# Patient Record
Sex: Female | Born: 1955
Health system: Southern US, Community
[De-identification: ages and names within clinical notes are randomized; demographics above are authoritative.]

## PROBLEM LIST (undated history)

## (undated) DIAGNOSIS — M8580 Other specified disorders of bone density and structure, unspecified site: Secondary | ICD-10-CM

## (undated) DIAGNOSIS — I10 Essential (primary) hypertension: Secondary | ICD-10-CM

## (undated) DIAGNOSIS — E785 Hyperlipidemia, unspecified: Secondary | ICD-10-CM

## (undated) HISTORY — DX: Other specified disorders of bone density and structure, unspecified site: M85.80

## (undated) HISTORY — PX: BUNIONECTOMY: SHX129

## (undated) HISTORY — DX: Essential (primary) hypertension: I10

## (undated) HISTORY — DX: Hyperlipidemia, unspecified: E78.5

---

## 1988-10-31 HISTORY — PX: MANDIBLE SURGERY: SHX707

## 2000-04-05 ENCOUNTER — Other Ambulatory Visit: Admission: RE | Admit: 2000-04-05 | Discharge: 2000-04-05 | Payer: Self-pay | Admitting: Obstetrics and Gynecology

## 2002-05-07 ENCOUNTER — Other Ambulatory Visit: Admission: RE | Admit: 2002-05-07 | Discharge: 2002-05-07 | Payer: Self-pay | Admitting: Obstetrics and Gynecology

## 2003-06-16 ENCOUNTER — Other Ambulatory Visit: Admission: RE | Admit: 2003-06-16 | Discharge: 2003-06-16 | Payer: Self-pay | Admitting: Obstetrics and Gynecology

## 2004-08-09 ENCOUNTER — Other Ambulatory Visit: Admission: RE | Admit: 2004-08-09 | Discharge: 2004-08-09 | Payer: Self-pay | Admitting: Obstetrics and Gynecology

## 2005-11-09 ENCOUNTER — Other Ambulatory Visit: Admission: RE | Admit: 2005-11-09 | Discharge: 2005-11-09 | Payer: Self-pay | Admitting: Obstetrics and Gynecology

## 2007-03-12 ENCOUNTER — Ambulatory Visit: Payer: Self-pay | Admitting: Gastroenterology

## 2007-04-06 ENCOUNTER — Encounter: Payer: Self-pay | Admitting: Gastroenterology

## 2007-04-06 ENCOUNTER — Ambulatory Visit: Payer: Self-pay | Admitting: Gastroenterology

## 2008-05-29 LAB — CONVERTED CEMR LAB: Pap Smear: NORMAL

## 2008-08-04 ENCOUNTER — Ambulatory Visit: Payer: Self-pay | Admitting: *Deleted

## 2008-08-04 DIAGNOSIS — L259 Unspecified contact dermatitis, unspecified cause: Secondary | ICD-10-CM

## 2008-08-04 DIAGNOSIS — G43009 Migraine without aura, not intractable, without status migrainosus: Secondary | ICD-10-CM | POA: Insufficient documentation

## 2008-08-04 DIAGNOSIS — E785 Hyperlipidemia, unspecified: Secondary | ICD-10-CM | POA: Insufficient documentation

## 2008-08-19 ENCOUNTER — Encounter (INDEPENDENT_AMBULATORY_CARE_PROVIDER_SITE_OTHER): Payer: Self-pay | Admitting: *Deleted

## 2008-09-15 ENCOUNTER — Ambulatory Visit: Payer: Self-pay | Admitting: *Deleted

## 2008-09-15 DIAGNOSIS — B354 Tinea corporis: Secondary | ICD-10-CM | POA: Insufficient documentation

## 2009-08-31 ENCOUNTER — Encounter: Admission: RE | Admit: 2009-08-31 | Discharge: 2009-10-21 | Payer: Self-pay | Admitting: Orthopedic Surgery

## 2011-03-15 NOTE — Assessment & Plan Note (Signed)
[facility]                         GASTROENTEROLOGY OFFICE NOTE   NAME:Worden, Donna Mercer                        MRN:          [id number]  DATE:03/12/2007                            DOB:          01/12/1956    REFERRING PHYSICIAN:  Randye Lobo, M.D.   REASON FOR REFERRAL:  Dr. Edward Jolly asked Donna Mercer to see me in  consultation regarding colorectal cancer screening.   HISTORY OF PRESENT ILLNESS:  Donna Mercer is a very pleasant, 55 year old  woman who was initially referred here for colorectal cancer screening.  By coincidence 2 weeks ago, she had a day of several loose stools,  vomiting and abdominal discomfort. Since then she has had 1 or 2 loose  stools on a daily basis. This has been nonbloody and non-mucusy. Her  abdominal discomfort has subsided.   REVIEW OF SYSTEMS:  Notable for stable weight and is otherwise  essentially normal and is available on the nursing intake sheet.   PAST MEDICAL HISTORY:  Elevated cholesterol, status post tubal ligation,  status post C section, jaw surgery in 1990.   CURRENT MEDICATIONS:  Lipitor and multivitamin.   ALLERGIES:  No known drug allergies.   SOCIAL HISTORY:  Married with 2 children, works for [employer].  Nonsmoker, drinks 1 or 2 alcoholic drinks a year.   FAMILY HISTORY:  Father with alcoholism, grandmother with breast cancer,  cousin with Crohn's disease, no colon cancer or colon polyps in family.   PHYSICAL EXAMINATION:  VITAL SIGNS:  5 foot 4, 161 pounds. Blood  pressure 152/98, pulse 80.  CONSTITUTIONAL:  Generally well appearing.  NEUROLOGIC:  Alert and oriented x3.  EYES:  Extraocular movements intact.  MOUTH:  Oropharynx moist, no lesions.  NECK:  Supple, no lymphadenopathy.  CARDIOVASCULAR:  Heart regular rate and rhythm.  LUNGS:  Clear to auscultation bilaterally.  ABDOMEN:  Soft, nontender, nondistended, normal bowel sounds.  EXTREMITIES:  No lower extremity edema.  SKIN:  No rashes  or lesions on visible extremities.   ASSESSMENT/PLAN:  A 55 year old woman at routine risk for colorectal  cancer, recent vomiting and diarrheal illness with continued loose  stools.   I think we should proceed with a full colonoscopy at her soonest  convenience. She most likely had a virus recently causing nausea and  vomiting but she is left with looser than usual stools. My plan will be  to perform a full colonoscopy for colorectal cancer screening reasons.  Will also plan to look in her terminal ileum. If everything looks  normal, I will perform a random biopsy of her colon for microscopic  colitis. I see no reason for any further blood tests or imaging studies  prior to then.     Rachael Fee, MD  Electronically Signed    DPJ/MedQ  DD: 03/12/2007  DT: 03/12/2007  Job #: [id number]   cc:   Randye Lobo, M.D.

## 2013-09-08 DIAGNOSIS — I10 Essential (primary) hypertension: Secondary | ICD-10-CM | POA: Insufficient documentation

## 2015-04-24 DIAGNOSIS — R7303 Prediabetes: Secondary | ICD-10-CM | POA: Insufficient documentation

## 2017-02-14 ENCOUNTER — Encounter: Payer: Self-pay | Admitting: Gastroenterology

## 2019-05-15 ENCOUNTER — Ambulatory Visit: Payer: Managed Care, Other (non HMO) | Admitting: Podiatry

## 2019-05-16 ENCOUNTER — Ambulatory Visit: Payer: Managed Care, Other (non HMO) | Admitting: Podiatry

## 2019-05-17 ENCOUNTER — Ambulatory Visit (INDEPENDENT_AMBULATORY_CARE_PROVIDER_SITE_OTHER): Payer: Managed Care, Other (non HMO) | Admitting: Podiatry

## 2019-05-17 ENCOUNTER — Other Ambulatory Visit: Payer: Self-pay

## 2019-05-17 ENCOUNTER — Encounter: Payer: Self-pay | Admitting: Podiatry

## 2019-05-17 ENCOUNTER — Ambulatory Visit (INDEPENDENT_AMBULATORY_CARE_PROVIDER_SITE_OTHER): Payer: Managed Care, Other (non HMO)

## 2019-05-17 VITALS — BP 141/68 | HR 85 | Temp 97.2°F | Resp 16

## 2019-05-17 DIAGNOSIS — M2011 Hallux valgus (acquired), right foot: Secondary | ICD-10-CM

## 2019-05-17 DIAGNOSIS — M2012 Hallux valgus (acquired), left foot: Secondary | ICD-10-CM

## 2019-05-17 DIAGNOSIS — J309 Allergic rhinitis, unspecified: Secondary | ICD-10-CM | POA: Insufficient documentation

## 2019-05-17 NOTE — Progress Notes (Signed)
   Subjective:    Patient ID: Donna Mercer, female    DOB: 04/03/1956, 63 y.o.   MRN: [id number]  HPI    Review of Systems  All other systems reviewed and are negative.      Objective:   Physical Exam        Assessment & Plan:

## 2019-05-17 NOTE — Patient Instructions (Signed)
Bunion  A bunion is a bump on the base of the big toe that forms when the bones of the big toe joint move out of position. Bunions may be small at first, but they often get larger over time. They can make walking painful. What are the causes? A bunion may be caused by:  Wearing narrow or pointed shoes that force the big toe to press against the other toes.  Abnormal foot development that causes the foot to roll inward (pronate).  Changes in the foot that are caused by certain diseases, such as rheumatoid arthritis or polio.  A foot injury. What increases the risk? The following factors may make you more likely to develop this condition:  Wearing shoes that squeeze the toes together.  Having certain diseases, such as: ? Rheumatoid arthritis. ? Polio. ? Cerebral palsy.  Having family members who have bunions.  Being born with a foot deformity, such as flat feet or low arches.  Doing activities that put a lot of pressure on the feet, such as ballet dancing. What are the signs or symptoms? The main symptom of a bunion is a noticeable bump on the big toe. Other symptoms may include:  Pain.  Swelling around the big toe.  Redness and inflammation.  Thick or hardened skin on the big toe or between the toes.  Stiffness or loss of motion in the big toe.  Trouble with walking. How is this diagnosed? A bunion may be diagnosed based on your symptoms, medical history, and activities. You may have tests, such as:  X-rays. These allow your health care provider to check the position of the bones in your foot and look for damage to your joint. They also help your health care provider determine the severity of your bunion and the best way to treat it.  Joint aspiration. In this test, a sample of fluid is removed from the toe joint. This test may be done if you are in a lot of pain. It helps rule out diseases that cause painful swelling of the joints, such as arthritis. How is this  treated? Treatment depends on the severity of your symptoms. The goal of treatment is to relieve symptoms and prevent the bunion from getting worse. Your health care provider may recommend:  Wearing shoes that have a wide toe box.  Using bunion pads to cushion the affected area.  Taping your toes together to keep them in a normal position.  Placing a device inside your shoe (orthotics) to help reduce pressure on your toe joint.  Taking medicine to ease pain, inflammation, and swelling.  Applying heat or ice to the affected area.  Doing stretching exercises.  Surgery to remove scar tissue and move the toes back into their normal position. This treatment is rare. Follow these instructions at home: Managing pain, stiffness, and swelling   If directed, put ice on the painful area: ? Put ice in a plastic bag. ? Place a towel between your skin and the bag. ? Leave the ice on for 20 minutes, 2-3 times a day. Activity   If directed, apply heat to the affected area before you exercise. Use the heat source that your health care provider recommends, such as a moist heat pack or a heating pad. ? Place a towel between your skin and the heat source. ? Leave the heat on for 20-30 minutes. ? Remove the heat if your skin turns bright red. This is especially important if you are unable to feel pain,   heat, or cold. You may have a greater risk of getting burned.  Do exercises as told by your health care provider. General instructions  Support your toe joint with proper footwear, shoe padding, or taping as told by your health care provider.  Take over-the-counter and prescription medicines only as told by your health care provider.  Keep all follow-up visits as told by your health care provider. This is important. Contact a health care provider if your symptoms:  Get worse.  Do not improve in 2 weeks. Get help right away if you have:  Severe pain and trouble with walking. Summary  A  bunion is a bump on the base of the big toe that forms when the bones of the big toe joint move out of position.  Bunions can make walking painful.  Treatment depends on the severity of your symptoms.  Support your toe joint with proper footwear, shoe padding, or taping as told by your health care provider. This information is not intended to replace advice given to you by your health care provider. Make sure you discuss any questions you have with your health care provider. Document Released: 10/17/2005 Document Revised: 04/23/2018 Document Reviewed: 02/27/2018 Elsevier Patient Education  2020 Elsevier Inc.  

## 2019-05-23 NOTE — Progress Notes (Signed)
Subjective:   Patient ID: Domingo Sep, female   DOB: 63 y.o.   MRN: [id number]   HPI Patient states she is had a lot of pain around her bunion site left over right and is tried wider shoes has tried other modalities without relief of symptoms.  Patient states is been getting increasingly sore over the last year and patient does not smoke and likes to be active   Review of Systems  All other systems reviewed and are negative.       Objective:  Physical Exam Vitals signs and nursing note reviewed.  Constitutional:      Appearance: She is well-developed.  Pulmonary:     Effort: Pulmonary effort is normal.  Musculoskeletal: Normal range of motion.  Skin:    General: Skin is warm.  Neurological:     Mental Status: She is alert.     Neurovascular status intact muscle strength is adequate range of motion within normal limits with patient found to have hyperostosis first metatarsal head left over right with redness around the joint surface and pain with palpation.  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  She wants to undergo this and we educated her on the surgical intervention required and she will be seen back 2 weeks to go over in great detail and is tentatively scheduled for August.  We will go over all questions and we will go over in detail procedure to be done for distal osteotomy  X-ray indicates that there is elevation of the intermetatarsal angle bilateral with mild deviation of the hallux against the second toe bilateral

## 2019-05-31 ENCOUNTER — Other Ambulatory Visit: Payer: Self-pay

## 2019-05-31 ENCOUNTER — Ambulatory Visit (INDEPENDENT_AMBULATORY_CARE_PROVIDER_SITE_OTHER): Payer: Managed Care, Other (non HMO) | Admitting: Podiatry

## 2019-05-31 ENCOUNTER — Encounter: Payer: Self-pay | Admitting: Podiatry

## 2019-05-31 ENCOUNTER — Telehealth: Payer: Self-pay | Admitting: *Deleted

## 2019-05-31 VITALS — Temp 97.9°F

## 2019-05-31 DIAGNOSIS — M2012 Hallux valgus (acquired), left foot: Secondary | ICD-10-CM

## 2019-05-31 DIAGNOSIS — M2011 Hallux valgus (acquired), right foot: Secondary | ICD-10-CM | POA: Diagnosis not present

## 2019-05-31 NOTE — Patient Instructions (Signed)
Pre-Operative Instructions  Congratulations, you have decided to take an important step towards improving your quality of life.  You can be assured that the doctors and staff at [facility] will be with you every step of the way.  Here are some important things you should know:  1. Plan to be at the surgery center/hospital at least 1 (one) hour prior to your scheduled time, unless otherwise directed by the surgical center/hospital staff.  You must have a responsible adult accompany you, remain during the surgery and drive you home.  Make sure you have directions to the surgical center/hospital to ensure you arrive on time. 2. If you are having surgery at [facility] or [facility] hospitals, you will need a copy of your medical history and physical form from your family physician within one month prior to the date of surgery. We will give you a form for your primary physician to complete.  3. We make every effort to accommodate the date you request for surgery.  However, there are times where surgery dates or times have to be moved.  We will contact you as soon as possible if a change in schedule is required.   4. No aspirin/ibuprofen for one week before surgery.  If you are on aspirin, any non-steroidal anti-inflammatory medications (Mobic, Aleve, Ibuprofen) should not be taken seven (7) days prior to your surgery.  You make take Tylenol for pain prior to surgery.  5. Medications - If you are taking daily heart and blood pressure medications, seizure, reflux, allergy, asthma, anxiety, pain or diabetes medications, make sure you notify the surgery center/hospital before the day of surgery so they can tell you which medications you should take or avoid the day of surgery. 6. No food or drink after midnight the night before surgery unless directed otherwise by surgical center/hospital staff. 7. No alcoholic beverages 24-hours prior to surgery.  No smoking 24-hours prior or 24-hours after  surgery. 8. Wear loose pants or shorts. They should be loose enough to fit over bandages, boots, and casts. 9. Don't wear slip-on shoes. Sneakers are preferred. 10. Bring your boot with you to the surgery center/hospital.  Also bring crutches or a walker if your physician has prescribed it for you.  If you do not have this equipment, it will be provided for you after surgery. 11. If you have not been contacted by the surgery center/hospital by the day before your surgery, call to confirm the date and time of your surgery. 12. Leave-time from work may vary depending on the type of surgery you have.  Appropriate arrangements should be made prior to surgery with your employer. 13. Prescriptions will be provided immediately following surgery by your doctor.  Fill these as soon as possible after surgery and take the medication as directed. Pain medications will not be refilled on weekends and must be approved by the doctor. 14. Remove nail polish on the operative foot and avoid getting pedicures prior to surgery. 15. Wash the night before surgery.  The night before surgery wash the foot and leg well with water and the antibacterial soap provided. Be sure to pay special attention to beneath the toenails and in between the toes.  Wash for at least three (3) minutes. Rinse thoroughly with water and dry well with a towel.  Perform this wash unless told not to do so by your physician.  Enclosed: 1 Ice pack (please put in freezer the night before surgery)   1 Hibiclens skin cleaner     Pre-op instructions  If you have any questions regarding the instructions, please do not hesitate to call our office.  [place]: [address], [place], [place] [address] -- [phone]  [place]: [address] [place], [place] [address] -- [phone]  [place]: [address]  [place], [place] [address] -- [phone]  [place]: [address], [address], [place], [place] [address] -- [phone]  Website:  [email or web address] 

## 2019-05-31 NOTE — Telephone Encounter (Signed)
DOS 06/18/2019; [facility] - [id number] LT FOOT  [employer]: Effective Date - 10/31/2018  Deductible: $7078 met doesn't apply to out of pocket Co-insurance:80% Out of pocket: $3375 not met   AUTHORIZATION IS NOT REQUIRED  Call Reference #  [id number]

## 2019-05-31 NOTE — Progress Notes (Signed)
Subjective:   Patient ID: Donna Mercer, female   DOB: 63 y.o.   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  Patient wants surgery and at this point after extensive review she signed consent form and is scheduled for outpatient procedure.  Understands total recovery to take 6 months to 1 year and I did give her a boot that I want her to use postoperatively with instructions on getting used to it prior to procedure

## 2019-06-18 ENCOUNTER — Encounter: Payer: Self-pay | Admitting: Podiatry

## 2019-06-18 ENCOUNTER — Telehealth: Payer: Self-pay | Admitting: *Deleted

## 2019-06-18 DIAGNOSIS — M2012 Hallux valgus (acquired), left foot: Secondary | ICD-10-CM

## 2019-06-18 NOTE — Telephone Encounter (Signed)
I called pt and informed that today's surgery dressing needed to stay intact, dry and clean and to call with concerns.

## 2019-06-18 NOTE — Telephone Encounter (Signed)
Pt called asked when she should change the surgery dressing from today's surgery with Dr. Paulla Dolly.

## 2019-06-21 NOTE — Progress Notes (Signed)
Spoke with patient in regards to post operative status. She states that she is doing well and managing her pain.  Denies fever chills nausea vomiting.  She states that she is resting, icing, and elevating her foot.  Advised her to call with any symptom changes or questions or concerns.

## 2019-06-21 NOTE — Progress Notes (Signed)
DOS 06/18/2019 [facility]

## 2019-06-26 ENCOUNTER — Other Ambulatory Visit: Payer: Managed Care, Other (non HMO)

## 2019-06-27 ENCOUNTER — Ambulatory Visit (INDEPENDENT_AMBULATORY_CARE_PROVIDER_SITE_OTHER): Payer: Managed Care, Other (non HMO)

## 2019-06-27 ENCOUNTER — Other Ambulatory Visit: Payer: Self-pay

## 2019-06-27 ENCOUNTER — Ambulatory Visit (INDEPENDENT_AMBULATORY_CARE_PROVIDER_SITE_OTHER): Payer: Managed Care, Other (non HMO) | Admitting: Podiatry

## 2019-06-27 ENCOUNTER — Encounter: Payer: Self-pay | Admitting: Podiatry

## 2019-06-27 DIAGNOSIS — M2012 Hallux valgus (acquired), left foot: Secondary | ICD-10-CM

## 2019-06-27 DIAGNOSIS — M2011 Hallux valgus (acquired), right foot: Secondary | ICD-10-CM | POA: Diagnosis not present

## 2019-06-27 DIAGNOSIS — Z09 Encounter for follow-up examination after completed treatment for conditions other than malignant neoplasm: Secondary | ICD-10-CM

## 2019-06-28 NOTE — Progress Notes (Signed)
Subjective:   Patient ID: Donna Mercer, female   DOB: 63 y.o.   MRN: [id number]   HPI Patient states doing great with left foot with minimal discomfort and states walking with a good heel toe gait pattern   ROS      Objective:  Physical Exam  Neurovascular status intact negative Homans sign noted patient's left foot healing well wound edges well coapted hallux in rectus position wound edges doing well with no drainage     Assessment:  Doing well post Liane Comber type osteotomy left     Plan:  H&P condition reviewed and recommended continued compression elevation and continued boot usage with gradual increase in activity and reappoint 2 weeks or earlier if needed  X-ray indicates osteotomies healing well good alignment with no signs of pathology with fixation in place

## 2019-07-01 ENCOUNTER — Telehealth: Payer: Self-pay | Admitting: *Deleted

## 2019-07-01 NOTE — Telephone Encounter (Signed)
Pt states she had bunion surgery 06/18/2019 and last appt 06/25/2019 was given a compression wrap and would like to know if she needed to sleep in it.

## 2019-07-01 NOTE — Telephone Encounter (Signed)
I told pt that if she had a dressing or swelling through the day then to sleep in the compression wrap, if none of those she could just wear the compression wrap when up.

## 2019-07-15 ENCOUNTER — Encounter: Payer: Managed Care, Other (non HMO) | Admitting: Podiatry

## 2019-07-17 ENCOUNTER — Encounter: Payer: Self-pay | Admitting: Podiatry

## 2019-07-17 ENCOUNTER — Ambulatory Visit (INDEPENDENT_AMBULATORY_CARE_PROVIDER_SITE_OTHER): Payer: Managed Care, Other (non HMO)

## 2019-07-17 ENCOUNTER — Ambulatory Visit (INDEPENDENT_AMBULATORY_CARE_PROVIDER_SITE_OTHER): Payer: Self-pay | Admitting: Podiatry

## 2019-07-17 ENCOUNTER — Other Ambulatory Visit: Payer: Self-pay

## 2019-07-17 DIAGNOSIS — Z09 Encounter for follow-up examination after completed treatment for conditions other than malignant neoplasm: Secondary | ICD-10-CM

## 2019-07-17 DIAGNOSIS — M2011 Hallux valgus (acquired), right foot: Secondary | ICD-10-CM

## 2019-07-17 DIAGNOSIS — M2012 Hallux valgus (acquired), left foot: Secondary | ICD-10-CM

## 2019-07-23 NOTE — Progress Notes (Signed)
Subjective:   Patient ID: Donna Mercer, female   DOB: 63 y.o.   MRN: [id number]   HPI Patient states doing very well with minimal discomfort in my right foot   ROS      Objective:  Physical Exam  Neurovascular status intact negative Homans sign noted foot healing well wound edges well coapted hallux in rectus position with fixation in place     Assessment:  Doing well post osteotomy first metatarsal right     Plan:  H&P x-ray reviewed and discussed continue being careful with this and wearing surgical shoe or rigid bottom shoes for the next 3 to 4 weeks.  Discussed reduced activity and reappoint to recheck  X-ray indicates there is some stress on the osteotomy site but overall healing well

## 2019-09-04 ENCOUNTER — Other Ambulatory Visit: Payer: Managed Care, Other (non HMO)

## 2019-09-04 ENCOUNTER — Ambulatory Visit: Payer: Managed Care, Other (non HMO) | Admitting: Podiatry

## 2019-09-13 ENCOUNTER — Ambulatory Visit (INDEPENDENT_AMBULATORY_CARE_PROVIDER_SITE_OTHER): Payer: Managed Care, Other (non HMO) | Admitting: Podiatry

## 2019-09-13 ENCOUNTER — Ambulatory Visit (INDEPENDENT_AMBULATORY_CARE_PROVIDER_SITE_OTHER): Payer: Managed Care, Other (non HMO)

## 2019-09-13 ENCOUNTER — Other Ambulatory Visit: Payer: Self-pay

## 2019-09-13 ENCOUNTER — Encounter: Payer: Self-pay | Admitting: Podiatry

## 2019-09-13 DIAGNOSIS — M2012 Hallux valgus (acquired), left foot: Secondary | ICD-10-CM

## 2019-09-17 NOTE — Progress Notes (Signed)
Subjective:   Patient ID: Domingo Sep, female   DOB: 63 y.o.   MRN: [id number]   HPI Patient presents stating she is doing well with her left foot and so far is very pleased with how everything has progressed   ROS      Objective:  Physical Exam  Neurovascular status intact negative Bevelyn Buckles' sign noted with patient's foot healing well wound edges well coapted hallux in rectus position with no discomfort noted currently     Assessment:  Overall doing well post osteotomy left first metatarsal     Plan:  Reviewed x-ray return to normal activity and will be seen back with possible pin removal at 1 point in future if symptoms indicate  X-rays indicate osteotomies healing well fixation in place joint congruence

## 2019-11-22 MED FILL — ATORVASTATIN 20 MG TABLET: 20 | 90 days supply | Qty: 90 | Fill #0

## 2019-11-25 MED FILL — LISINOPRIL-HCTZ 10-12.5 MG: 10-12.5 | 90 days supply | Qty: 90 | Fill #0

## 2020-02-25 MED FILL — ATORVASTATIN 20 MG TABLET: 20 | 90 days supply | Qty: 90 | Fill #1

## 2020-02-25 MED FILL — LISINOPRIL-HCTZ 10-12.5 MG: 10-12.5 | 90 days supply | Qty: 90 | Fill #1

## 2020-04-16 MED FILL — valACYclovir HCL 1 GM TABS: 1 | 1 days supply | Qty: 4 | Fill #0

## 2020-04-22 MED FILL — SULFAMETHOXAZOLE-TMP DS TAB: 800-160 | 3 days supply | Qty: 6 | Fill #0

## 2020-04-30 ENCOUNTER — Ambulatory Visit (INDEPENDENT_AMBULATORY_CARE_PROVIDER_SITE_OTHER): Payer: Managed Care, Other (non HMO) | Admitting: Orthopedic Surgery

## 2020-04-30 ENCOUNTER — Ambulatory Visit (INDEPENDENT_AMBULATORY_CARE_PROVIDER_SITE_OTHER): Payer: Managed Care, Other (non HMO)

## 2020-04-30 DIAGNOSIS — M25511 Pain in right shoulder: Secondary | ICD-10-CM | POA: Diagnosis not present

## 2020-04-30 DIAGNOSIS — M7501 Adhesive capsulitis of right shoulder: Secondary | ICD-10-CM

## 2020-05-04 ENCOUNTER — Encounter: Payer: Self-pay | Admitting: Orthopedic Surgery

## 2020-05-04 NOTE — Progress Notes (Signed)
Office Visit Note   Patient: Donna Mercer           Date of Birth: 1956/07/16           MRN: [id number] Visit Date: 04/30/2020 Requested by: Macy Mis, MD [address] [place],  [place] [address] PCP: Macy Mis, MD  Subjective: Chief Complaint  Patient presents with  . Right Shoulder - Pain    HPI: TELISA OHLSEN is a 64 y.o. female who presents to the office complaining of right shoulder pain.  Patient notes right shoulder pain for 1 month duration.  She denies any acute injury leading to onset of pain.  Pain is worsening over time.  Pain is worse with reaching for something.  She has difficulty sleeping on the right side.  She denies any subjective weakness.  Denies any neck pain, numbness/tingling, history of shoulder/neck surgery.  She does note increased pain when she is trying to put her bra on.  Localizes pain to the lateral aspect of the right shoulder.  She has been taking meloxicam from her primary care physician and this provided no relief but made her dizzy.  She has no history of diabetes or any thyroid issues..                ROS:  All systems reviewed are negative as they relate to the chief complaint within the history of present illness.  Patient denies fevers or chills.  Assessment & Plan: Visit Diagnoses:  1. Right shoulder pain, unspecified chronicity   2. Adhesive capsulitis of right shoulder     Plan: Patient is a 64 year old female presents complaining of right shoulder pain.  She has had insidious onset of right shoulder pain over the last month with worsening.  No injury she can recall.  On exam she has significantly reduced passive range of motion of the right shoulder compared to the contralateral side.  Impression is right shoulder adhesive capsulitis.  Discussed options available to patient.  After discussion, patient wishes to proceed with right shoulder glenohumeral injection.  Home exercise program given to patient and reviewed  with her today.  Encouraged her to do these exercises every day over the next 6 weeks.  Follow-up in 6 weeks for clinical recheck.  We will consider another injection versus formal physical therapy at that time if she has no improvement.  Follow-Up Instructions: No follow-ups on file.   Orders:  Orders Placed This Encounter  Procedures  . XR Shoulder Right   No orders of the defined types were placed in this encounter.     Procedures: Large Joint Inj: R glenohumeral on 05/05/2020 7:21 AM Indications: diagnostic evaluation and pain Details: 18 G 1.5 in needle, posterior approach  Arthrogram: No  Medications: 9 mL bupivacaine 0.5 %; 40 mg methylPREDNISolone acetate 40 MG/ML; 5 mL lidocaine 1 % Outcome: tolerated well, no immediate complications Procedure, treatment alternatives, risks and benefits explained, specific risks discussed. Consent was given by the patient. Immediately prior to procedure a time out was called to verify the correct patient, procedure, equipment, support staff and site/side marked as required. Patient was prepped and draped in the usual sterile fashion.       Clinical Data: No additional findings.  Objective: Vital Signs: There were no vitals taken for this visit.  Physical Exam:  Constitutional: Patient appears well-developed HEENT:  Head: Normocephalic Eyes:EOM are normal Neck: Normal range of motion Cardiovascular: Normal rate Pulmonary/chest: Effort normal Neurologic: Patient  is alert Skin: Skin is warm Psychiatric: Patient has normal mood and affect  Ortho Exam:  Right shoulder Exam 100 degrees of forward flexion compared with 170 degrees on the left 90 degrees of abduction compared with 120 degrees on the left 25 degrees external rotation compared with 70 degrees on the left No TTP over the [facility] joint or bicipital groove Good subscapularis, supraspinatus, and infraspinatus strength Negative Hawkins impingement 5/5 grip strength, forearm  pronation/supination, and bicep strength  Specialty Comments:  No specialty comments available.  Imaging: No results found.   PMFS History: Patient Active Problem List   Diagnosis Date Noted  . Allergic rhinitis 05/17/2019  . Prediabetes 04/24/2015  . Hypertension 09/08/2013  . HYPERLIPIDEMIA 08/04/2008   No past medical history on file.  No family history on file.  No past surgical history on file. Social History   Occupational History  . Not on file  Tobacco Use  . Smoking status: Never Smoker  . Smokeless tobacco: Never Used  Substance and Sexual Activity  . Alcohol use: Not on file  . Drug use: Not on file  . Sexual activity: Not on file

## 2020-05-05 DIAGNOSIS — M7501 Adhesive capsulitis of right shoulder: Secondary | ICD-10-CM | POA: Diagnosis not present

## 2020-05-05 MED ORDER — BUPIVACAINE HCL 0.5 % IJ SOLN
9.0000 mL | INTRAMUSCULAR | Status: AC | PRN
  Administered 2020-05-05: 9 mL via INTRA_ARTICULAR

## 2020-05-05 MED ORDER — METHYLPREDNISOLONE ACETATE 40 MG/ML IJ SUSP
40.0000 mg | INTRAMUSCULAR | Status: AC | PRN
  Administered 2020-05-05: 40 mg via INTRA_ARTICULAR

## 2020-05-05 MED ORDER — LIDOCAINE HCL 1 % IJ SOLN
5.0000 mL | INTRAMUSCULAR | Status: AC | PRN
Start: 2020-05-05 — End: 2020-05-05
  Administered 2020-05-05: 5 mL

## 2020-05-05 NOTE — Progress Notes (Signed)
faxed

## 2020-05-25 MED FILL — LISINOPRIL-HCTZ 10-12.5 MG: 10-12.5 | 90 days supply | Qty: 90 | Fill #2

## 2020-05-25 MED FILL — ATORVASTATIN 20 MG TABLET: 20 | 90 days supply | Qty: 90 | Fill #2

## 2020-05-25 MED FILL — valACYclovir HCL 1 GM TABS: 1 | 1 days supply | Qty: 4 | Fill #1

## 2020-06-05 ENCOUNTER — Other Ambulatory Visit: Payer: Self-pay

## 2020-06-05 ENCOUNTER — Encounter: Payer: Self-pay | Admitting: Orthopedic Surgery

## 2020-06-05 ENCOUNTER — Ambulatory Visit (INDEPENDENT_AMBULATORY_CARE_PROVIDER_SITE_OTHER): Payer: Managed Care, Other (non HMO) | Admitting: Orthopedic Surgery

## 2020-06-05 DIAGNOSIS — M7501 Adhesive capsulitis of right shoulder: Secondary | ICD-10-CM

## 2020-06-05 MED ORDER — METHYLPREDNISOLONE ACETATE 40 MG/ML IJ SUSP
40.0000 mg | INTRAMUSCULAR | Status: AC | PRN
  Administered 2020-06-05: 40 mg via INTRA_ARTICULAR

## 2020-06-05 MED ORDER — LIDOCAINE HCL 1 % IJ SOLN
5.0000 mL | INTRAMUSCULAR | Status: AC | PRN
  Administered 2020-06-05: 5 mL

## 2020-06-05 MED ORDER — BUPIVACAINE HCL 0.5 % IJ SOLN
9.0000 mL | INTRAMUSCULAR | Status: AC | PRN
Start: 2020-06-05 — End: 2020-06-05
  Administered 2020-06-05: 9 mL via INTRA_ARTICULAR

## 2020-06-05 NOTE — Progress Notes (Signed)
Office Visit Note   Patient: Donna Mercer           Date of Birth: 1955-11-22           MRN: [id number] Visit Date: 06/05/2020 Requested by: Macy Mis, MD [address] [place],  [place] [address] PCP: Macy Mis, MD  Subjective: Chief Complaint  Patient presents with  . Left Foot - Pain  . Follow-up    HPI: Donna Mercer is a patient with right frozen shoulder.  Had glenohumeral joint injection 6 weeks ago.  Feels about 50% better.  Most of her pain is at night.  Takes Motrin at bedtime but still wakes with pain at times.              ROS: All systems reviewed are negative as they relate to the chief complaint within the history of present illness.  Patient denies  fevers or chills.   Assessment & Plan: Visit Diagnoses:  1. Adhesive capsulitis of right shoulder     Plan: Impression is right frozen shoulder with still some diminished passive range of motion but overall clinically she is improved.  Plan is for second and final glenohumeral joint injection today.  Discussed operative treatment if this should fail to continue to improve her symptoms.  Follow-up as needed.  Patient tolerated the injection well.  Follow-Up Instructions: Return if symptoms worsen or fail to improve.   Orders:  No orders of the defined types were placed in this encounter.  No orders of the defined types were placed in this encounter.     Procedures: Large Joint Inj: R glenohumeral on 06/05/2020 11:08 PM Indications: diagnostic evaluation and pain Details: 18 G 1.5 in needle, posterior approach  Arthrogram: No  Medications: 9 mL bupivacaine 0.5 %; 40 mg methylPREDNISolone acetate 40 MG/ML; 5 mL lidocaine 1 % Outcome: tolerated well, no immediate complications Procedure, treatment alternatives, risks and benefits explained, specific risks discussed. Consent was given by the patient. Immediately prior to procedure a time out was called to verify the correct patient,  procedure, equipment, support staff and site/side marked as required. Patient was prepped and draped in the usual sterile fashion.       Clinical Data: No additional findings.  Objective: Vital Signs: There were no vitals taken for this visit.  Physical Exam:   Constitutional: Patient appears well-developed HEENT:  Head: Normocephalic Eyes:EOM are normal Neck: Normal range of motion Cardiovascular: Normal rate Pulmonary/chest: Effort normal Neurologic: Patient is alert Skin: Skin is warm Psychiatric: Patient has normal mood and affect    Ortho Exam: Ortho exam demonstrates external rotation on the right at 15 degrees of abduction to about 40 degrees versus 70 on the left.  Isolated glenohumeral abduction is just under 90 on the right and about 110 on the left.  Rotator cuff strength is excellent.  Forward flexion is 180 on the left compared to 140 on the right.  She does have a little bit of crepitus with passive range of motion of that right shoulder primarily anterior lateral aspect.  Specialty Comments:  No specialty comments available.  Imaging: No results found.   PMFS History: Patient Active Problem List   Diagnosis Date Noted  . Allergic rhinitis 05/17/2019  . Prediabetes 04/24/2015  . Hypertension 09/08/2013  . HYPERLIPIDEMIA 08/04/2008   History reviewed. No pertinent past medical history.  History reviewed. No pertinent family history.  History reviewed. No pertinent surgical history. Social History   Occupational History  .  Not on file  Tobacco Use  . Smoking status: Never Smoker  . Smokeless tobacco: Never Used  Substance and Sexual Activity  . Alcohol use: Not on file  . Drug use: Not on file  . Sexual activity: Not on file

## 2020-07-28 MED FILL — FLUAD QUADRIVALENT 0.5 ML P: 0.5 | 1 days supply | Qty: 1 | Fill #0

## 2020-08-14 ENCOUNTER — Other Ambulatory Visit (HOSPITAL_BASED_OUTPATIENT_CLINIC_OR_DEPARTMENT_OTHER): Payer: Self-pay | Admitting: Internal Medicine

## 2020-08-14 ENCOUNTER — Ambulatory Visit: Payer: Managed Care, Other (non HMO) | Attending: Internal Medicine

## 2020-08-14 DIAGNOSIS — Z23 Encounter for immunization: Secondary | ICD-10-CM

## 2020-08-14 NOTE — Progress Notes (Signed)
   Covid-19 Vaccination Clinic  Name:  Donna Mercer    MRN: [id number] DOB: Mar 02, 1956  08/14/2020  [name] was observed post Covid-19 immunization for 15 minutes without incident. She was provided with Vaccine Information Sheet and instruction to access the V-Safe system. Vaccinated by [facility].  [name] was instructed to call 911 with any severe reactions post vaccine: Marland Kitchen Difficulty breathing  . Swelling of face and throat  . A fast heartbeat  . A bad rash all over body  . Dizziness and weakness

## 2020-08-18 MED FILL — LISINOPRIL-HCTZ 10-12.5 MG: 10-12.5 | 90 days supply | Qty: 90 | Fill #3

## 2020-08-24 MED FILL — PFIZER-BIONTECH COVID-19 VA: 30 | 1 days supply | Qty: 0 | Fill #0

## 2020-09-03 ENCOUNTER — Encounter: Payer: Self-pay | Admitting: Orthopedic Surgery

## 2020-09-04 NOTE — Telephone Encounter (Signed)
It depends how she is doing clinically.  The plan at last visit was the 2nd injection she received to be the final injection with manipulation under anesthesia as the next step.  I'd say come in so we can see her and assess her ROM and we can decide if she can just get a cortisone shot or if she needs manipulation, something else, etc.

## 2020-09-16 ENCOUNTER — Other Ambulatory Visit: Payer: Self-pay

## 2020-09-16 ENCOUNTER — Ambulatory Visit (INDEPENDENT_AMBULATORY_CARE_PROVIDER_SITE_OTHER): Payer: Managed Care, Other (non HMO) | Admitting: Orthopedic Surgery

## 2020-09-16 ENCOUNTER — Encounter: Payer: Self-pay | Admitting: Orthopedic Surgery

## 2020-09-16 VITALS — Ht 63.0 in | Wt 160.0 lb

## 2020-09-16 DIAGNOSIS — M7501 Adhesive capsulitis of right shoulder: Secondary | ICD-10-CM | POA: Diagnosis not present

## 2020-09-18 ENCOUNTER — Ambulatory Visit: Payer: Managed Care, Other (non HMO) | Admitting: Orthopedic Surgery

## 2020-09-19 ENCOUNTER — Encounter: Payer: Self-pay | Admitting: Orthopedic Surgery

## 2020-09-19 NOTE — Progress Notes (Signed)
Office Visit Note   Patient: Donna Mercer           Date of Birth: December 07, 1955           MRN: [id number] Visit Date: 09/16/2020 Requested by: Macy Mis, MD [address] [place],  [place] [address] PCP: Macy Mis, MD  Subjective: Chief Complaint  Patient presents with  . Right Shoulder - Pain    HPI: SHIASIA PORRO is a 64 y.o. female who presents to the office complaining of right shoulder pain.  She returns for reevaluation of right shoulder pain.  She has been previously treated for right shoulder adhesive capsulitis earlier this year and was doing really well after the last injection until couple of weeks ago when her pain began to return.  She denies any new injury leading to onset of pain.  She now wakes every night and states that her pain is significantly worse at night.  She finds it uncomfortable to sleep at night.  Pain is worse with overhead motion.  She denies any weakness and states that pain is the main complaint for her.  Denies any neck pain, numbness/tingling but does note trapezius pain and shoulder blade pain.  She has been taking Motrin with some relief.  She also notes difficulty hanging pans on her wall..                ROS: All systems reviewed are negative as they relate to the chief complaint within the history of present illness.  Patient denies fevers or chills.  Assessment & Plan: Visit Diagnoses:  1. Adhesive capsulitis of right shoulder     Plan: Patient is a 64 year old female who presents complaining of right shoulder pain.  She has history of right shoulder adhesive capsulitis which has been treated but now has returned with significant pain.  She has received 2 shoulder injections that have provided good relief but have not resolved her symptoms.  She has continued decreased range of motion of the right shoulder on exam when compared with contralateral side.  New today on exam is infraspinatus and supraspinatus weakness as well as  a small amount of anterior crepitus that is concerning for potential rotator cuff tear.  Plan to order MRI arthrogram of the right shoulder for evaluation of right rotator cuff tear versus frozen shoulder.  Patient agreed with plan.  Follow-up after MRI to review results.  Follow-Up Instructions: No follow-ups on file.   Orders:  Orders Placed This Encounter  Procedures  . MR SHOULDER RIGHT W CONTRAST  . Arthrogram   No orders of the defined types were placed in this encounter.     Procedures: No procedures performed   Clinical Data: No additional findings.  Objective: Vital Signs: Ht 5\' 3"  (1.6 m)   Wt 160 lb (72.6 kg)   BMI 28.34 kg/m   Physical Exam:  Constitutional: Patient appears well-developed HEENT:  Head: Normocephalic Eyes:EOM are normal Neck: Normal range of motion Cardiovascular: Normal rate Pulmonary/chest: Effort normal Neurologic: Patient is alert Skin: Skin is warm Psychiatric: Patient has normal mood and affect  Ortho Exam: Ortho exam demonstrates right shoulder with 30 degrees external rotation, 95 degrees abduction, 100 degrees forward flexion.  This is compared with the left shoulder with 70 degrees external rotation, 150 degrees abduction, 170 degrees forward flexion.  She has right infraspinatus and supraspinatus on exam.  Subscapularis with excellent strength.  5/5 motor strength of the bilateral grip strength, finger  abduction, pronation/supination, bicep, tricep, deltoid.  No tenderness over the [facility] joint.  Mild tenderness over the bicipital groove.  Mild anterior crepitus noted with passive range of motion of the right shoulder.  Specialty Comments:  No specialty comments available.  Imaging: No results found.   PMFS History: Patient Active Problem List   Diagnosis Date Noted  . Allergic rhinitis 05/17/2019  . Prediabetes 04/24/2015  . Hypertension 09/08/2013  . HYPERLIPIDEMIA 08/04/2008   No past medical history on file.  No family  history on file.  No past surgical history on file. Social History   Occupational History  . Not on file  Tobacco Use  . Smoking status: Never Smoker  . Smokeless tobacco: Never Used  Substance and Sexual Activity  . Alcohol use: Not on file  . Drug use: Not on file  . Sexual activity: Not on file

## 2020-09-20 ENCOUNTER — Encounter: Payer: Self-pay | Admitting: Orthopedic Surgery

## 2020-09-29 ENCOUNTER — Inpatient Hospital Stay: Admission: RE | Admit: 2020-09-29 | Payer: Managed Care, Other (non HMO) | Source: Ambulatory Visit

## 2020-09-29 ENCOUNTER — Other Ambulatory Visit: Payer: Managed Care, Other (non HMO)

## 2020-10-07 ENCOUNTER — Ambulatory Visit
Admission: RE | Admit: 2020-10-07 | Discharge: 2020-10-07 | Disposition: A | Discharge status: Home / Self Care | Payer: Managed Care, Other (non HMO) | Source: Ambulatory Visit | Attending: Orthopedic Surgery | Admitting: Orthopedic Surgery

## 2020-10-07 ENCOUNTER — Other Ambulatory Visit: Payer: Self-pay

## 2020-10-07 DIAGNOSIS — M7501 Adhesive capsulitis of right shoulder: Secondary | ICD-10-CM

## 2020-10-07 MED ORDER — IOPAMIDOL (ISOVUE-M 200) INJECTION 41%
13.0000 mL | Freq: Once | INTRAMUSCULAR | Status: AC
  Administered 2020-10-07: 13 mL via INTRA_ARTICULAR

## 2020-10-14 ENCOUNTER — Encounter: Payer: Self-pay | Admitting: Orthopedic Surgery

## 2020-10-15 NOTE — Telephone Encounter (Signed)
I talked with her about her rotator cuff tear.  Basically 8 mm or so tear with retraction which is fixable at this time.  I think it would likely remain fixable for a while.  Can you set her up for an appointment in March and we can reassess the strength and then decide for or against any type of operative therapy.  For now she states that her problem is "livable"

## 2020-11-13 ENCOUNTER — Other Ambulatory Visit (HOSPITAL_BASED_OUTPATIENT_CLINIC_OR_DEPARTMENT_OTHER): Payer: Self-pay | Admitting: Family Medicine

## 2020-11-13 MED FILL — LISINOPRIL-HCTZ 10-12.5 MG: 10-12.5 | 90 days supply | Qty: 90 | Fill #0

## 2020-12-11 LAB — HM PAP SMEAR

## 2020-12-15 ENCOUNTER — Other Ambulatory Visit (HOSPITAL_BASED_OUTPATIENT_CLINIC_OR_DEPARTMENT_OTHER): Payer: Self-pay | Admitting: Family Medicine

## 2020-12-15 MED FILL — ATORVASTATIN CALCIUM 40 MG: 40 | 90 days supply | Qty: 90 | Fill #0

## 2021-02-05 ENCOUNTER — Ambulatory Visit (INDEPENDENT_AMBULATORY_CARE_PROVIDER_SITE_OTHER): Payer: Managed Care, Other (non HMO) | Admitting: Surgical

## 2021-02-05 ENCOUNTER — Other Ambulatory Visit: Payer: Self-pay

## 2021-02-05 ENCOUNTER — Ambulatory Visit: Payer: Managed Care, Other (non HMO) | Attending: Internal Medicine

## 2021-02-05 DIAGNOSIS — M75121 Complete rotator cuff tear or rupture of right shoulder, not specified as traumatic: Secondary | ICD-10-CM | POA: Diagnosis not present

## 2021-02-05 DIAGNOSIS — Z23 Encounter for immunization: Secondary | ICD-10-CM

## 2021-02-05 NOTE — Progress Notes (Signed)
Office Visit Note   Patient: Donna Mercer           Date of Birth: Nov 05, 1955           MRN: [id number] Visit Date: 02/05/2021 Requested by: Macy Mis, MD [address] [place],  [place] [address] PCP: Macy Mis, MD  Subjective: Chief Complaint  Patient presents with  . Right Shoulder - Follow-up    HPI: Donna Mercer is a 65 y.o. female who presents to the office complaining of right shoulder pain.  Patient complains of continued pain with no significant change since last office visit.  She reports that she does have difficulty with some ADLs such as vacuuming, lifting overhead.  She is able to lift her arm overhead but feels that it is a little weaker than the other side so she does not trust it for lifting objects at sometimes.  She complains of waking with pain regularly but not every night.  She has had previous injections which gave her relief for several weeks.  Denies any neck pain or radicular pain down the arm..                ROS: All systems reviewed are negative as they relate to the chief complaint within the history of present illness.  Patient denies fevers or chills.  Assessment & Plan: Visit Diagnoses:  1. Nontraumatic complete tear of right rotator cuff     Plan:.  Patient is a 65 year old female who presents complaining of right shoulder pain.  She has had chronic right shoulder pain that she had MRI on 10/07/20 which revealed full-thickness tear of the distal supraspinatus with about 8 mm of retraction anteriorly.  She has no significant improvement or worsening of her symptoms in the last several months since discussing her MRI on the phone with Dr. August Saucer.  Discussed options available to patient today.  She is functioning well with the shoulder overall given the full-thickness tear she is dealing with but she does have continued discomfort and inconvenience, particularly at night.  She states that she is interested in surgical intervention to  get the shoulder feeling better and restoring the strength of the shoulder but wants to wait until winter 2023 for the surgery.  Plan to pursue surgery in January 2023. Discussed the risks/benefits of RTC repair as well as expected recovery timeline.  Will discuss patient's case with Dr. August Saucer regarding timeline of surgery given her tendinous retraction.    Follow-Up Instructions: No follow-ups on file.   Orders:  No orders of the defined types were placed in this encounter.  No orders of the defined types were placed in this encounter.     Procedures: No procedures performed   Clinical Data: No additional findings.  Objective: Vital Signs: There were no vitals taken for this visit.  Physical Exam:  Constitutional: Patient appears well-developed HEENT:  Head: Normocephalic Eyes:EOM are normal Neck: Normal range of motion Cardiovascular: Normal rate Pulmonary/chest: Effort normal Neurologic: Patient is alert Skin: Skin is warm Psychiatric: Patient has normal mood and affect  Ortho Exam: Ortho exam demonstrates right shoulder with 30 degrees external rotation, 90 degrees abduction, 130 degrees forward flexion.  This compared with the left shoulder with 45 degrees external rotation, 110 degrees abduction, 160 degrees forward flexion.  Weakness of the infraspinatus and supraspinatus of the right shoulder with excellent strength of the subscapularis.  No weakness of the rotator cuff of the contralateral shoulder.  Mild  tenderness over the bicipital groove bilaterally.  No tenderness over the [facility] joint bilaterally.  Able to actively lift the arm overhead  Specialty Comments:  No specialty comments available.  Imaging: No results found.   PMFS History: Patient Active Problem List   Diagnosis Date Noted  . Allergic rhinitis 05/17/2019  . Prediabetes 04/24/2015  . Hypertension 09/08/2013  . HYPERLIPIDEMIA 08/04/2008   No past medical history on file.  No family history on  file.  No past surgical history on file. Social History   Occupational History  . Not on file  Tobacco Use  . Smoking status: Never Smoker  . Smokeless tobacco: Never Used  Substance and Sexual Activity  . Alcohol use: Not on file  . Drug use: Not on file  . Sexual activity: Not on file

## 2021-02-06 ENCOUNTER — Encounter: Payer: Self-pay | Admitting: Surgical

## 2021-02-08 NOTE — Progress Notes (Signed)
   Covid-19 Vaccination Clinic  Name:  Donna Mercer    MRN: [id number] DOB: 1956/06/07  02/08/2021  [name] was observed post Covid-19 immunization for 15 minutes without incident. She was provided with Vaccine Information Sheet and instruction to access the V-Safe system.   [name] was instructed to call 911 with any severe reactions post vaccine: Marland Kitchen Difficulty breathing  . Swelling of face and throat  . A fast heartbeat  . A bad rash all over body  . Dizziness and weakness   Immunizations Administered    Name Date Dose VIS Date Route   PFIZER Comrnaty(Gray TOP) Covid-19 Vaccine 02/05/2021  3:30 PM 0.3 mL 10/08/2020 Intramuscular   Manufacturer: [employer], [employer]   Lot: [id number]   NDC: [id number]

## 2021-02-12 ENCOUNTER — Other Ambulatory Visit (HOSPITAL_BASED_OUTPATIENT_CLINIC_OR_DEPARTMENT_OTHER): Payer: Self-pay

## 2021-02-12 MED ORDER — PFIZER-BIONT COVID-19 VAC-TRIS 30 MCG/0.3ML IM SUSP
INTRAMUSCULAR | 0 refills | Status: AC
Start: 2021-02-05 — End: ?
  Filled 2021-02-12: qty 0.3, 1d supply, fill #0

## 2021-02-12 MED FILL — Lisinopril & Hydrochlorothiazide Tab 10-12.5 MG: ORAL | 90 days supply | Qty: 90 | Fill #0 | Status: AC

## 2021-03-18 ENCOUNTER — Other Ambulatory Visit (HOSPITAL_BASED_OUTPATIENT_CLINIC_OR_DEPARTMENT_OTHER): Payer: Self-pay

## 2021-03-18 MED FILL — Atorvastatin Calcium Tab 40 MG (Base Equivalent): ORAL | 90 days supply | Qty: 90 | Fill #0 | Status: AC

## 2021-03-31 IMAGING — MR MR SHOULDER*R* W/CM
6 series · 40 of 40 positions shown · IV contrast (agent unspecified)
Comparison: 04/30/2020

CLINICAL DATA: Right shoulder pain with decreased range of motion
for 6-8 months. Adhesive capsulitis.

EXAM:
MR ARTHROGRAM OF THE RIGHT SHOULDER
TECHNIQUE: Multiplanar, multisequence MR imaging of the right shoulder was
performed following the administration of intra-articular contrast.
CONTRAST:  See Injection Documentation.

[Series 3: T1 fat-sat · axial · 4.0mm · 0.27mm/px · z∈[-35,+49]mm · 8 of 18 slices shown (1 of 4)]
[im 1/18]
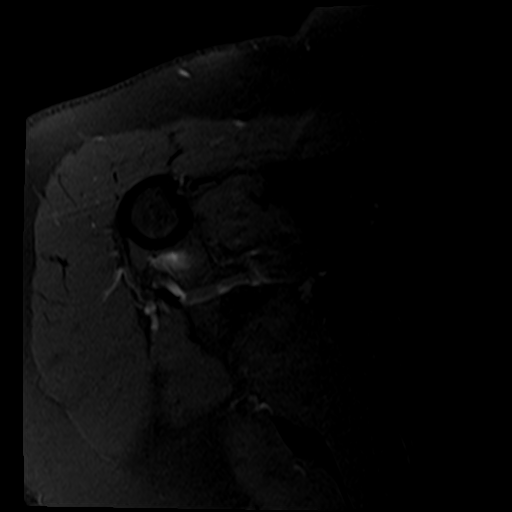
[im 3/18]
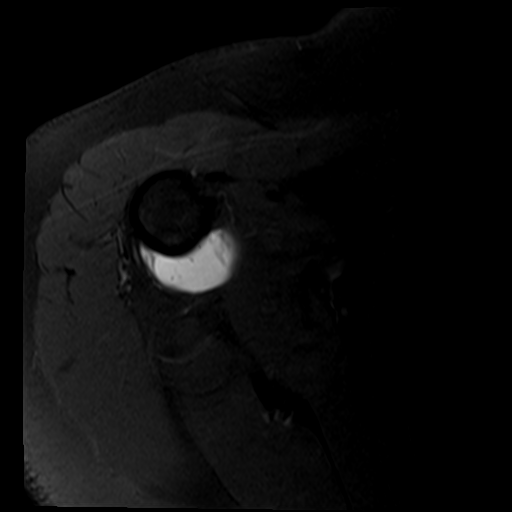
[im 5/18]
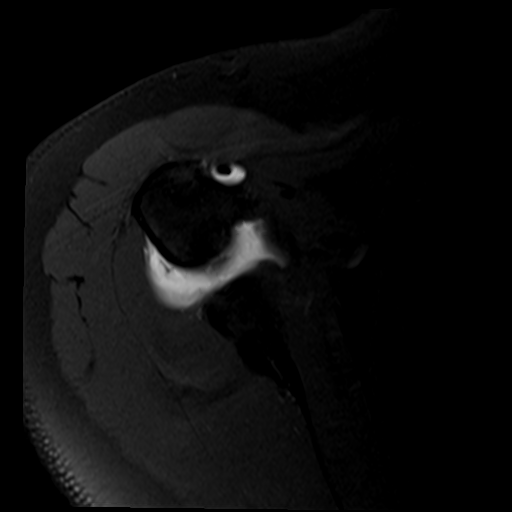
[im 8/18]
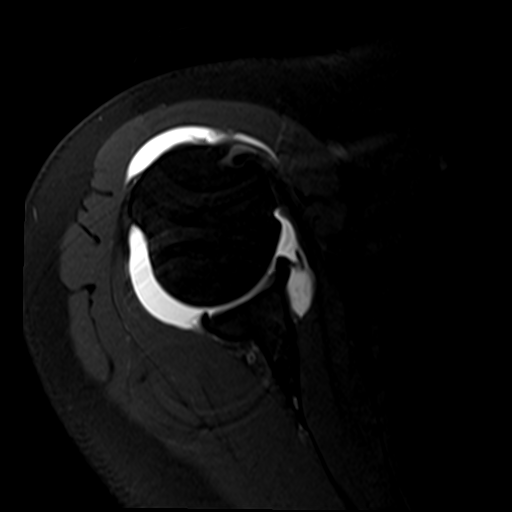
[im 10/18]
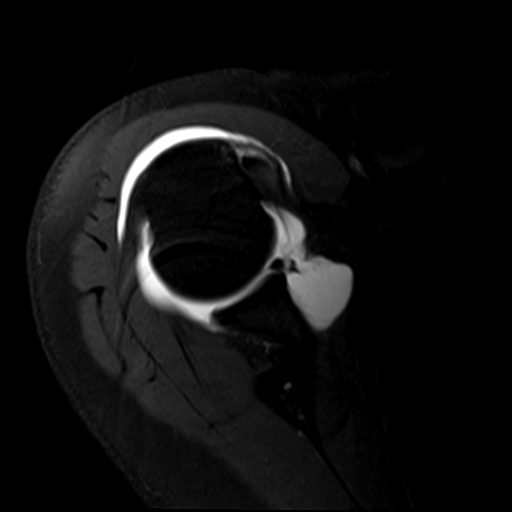
[im 13/18]
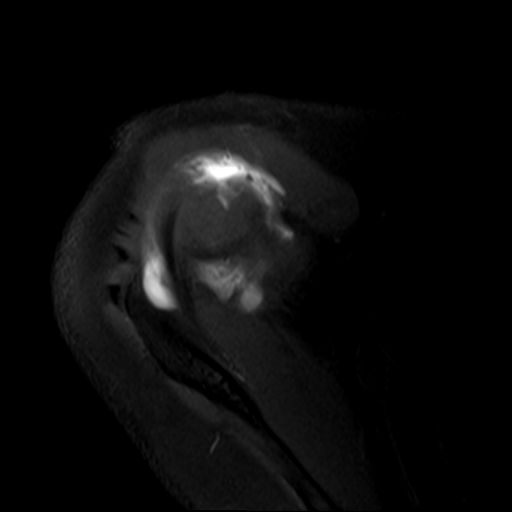
[im 15/18]
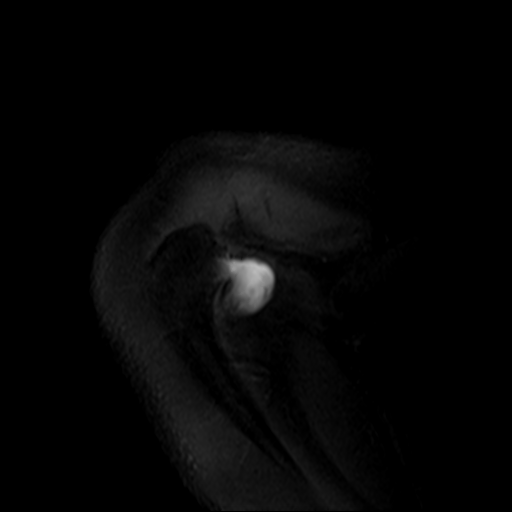
[im 18/18]
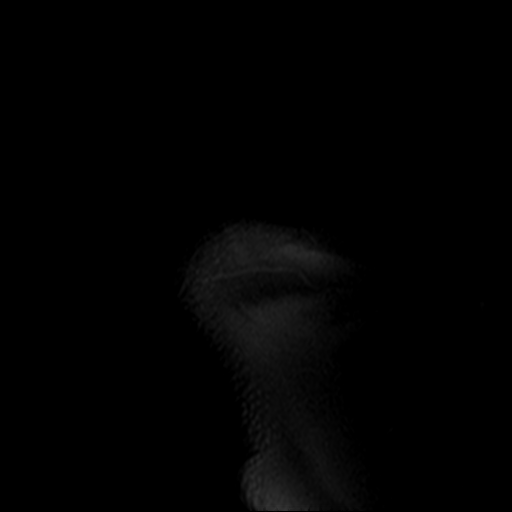

[Series 4: T2 fat-sat · oblique · 4.0mm · 0.55mm/px · 8 of 18 slices shown (1 of 2)]
[im 1/18]
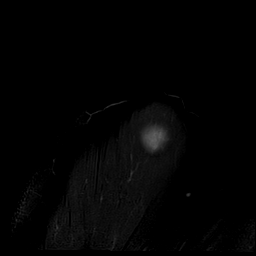
[im 3/18]
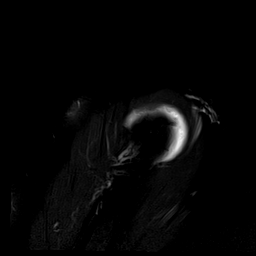
[im 5/18]
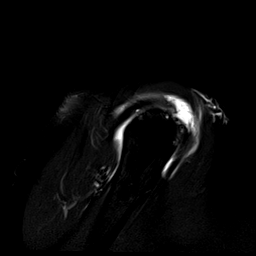
[im 8/18]
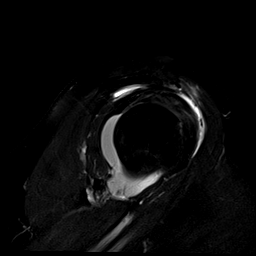
[im 10/18]
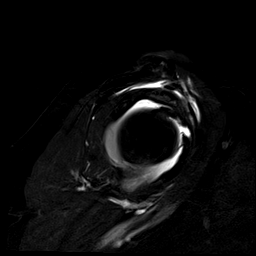
[im 13/18]
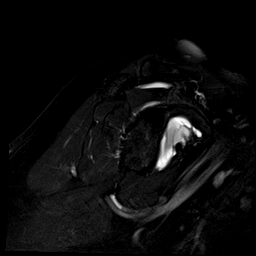
[im 15/18]
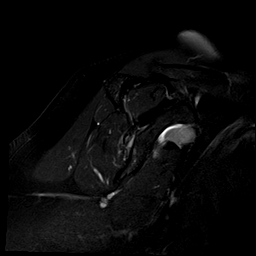
[im 18/18]
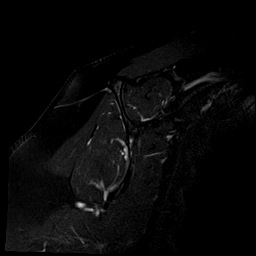

[Series 5: T1 fat-sat · sagittal · 4.0mm · 0.55mm/px · 6 of 16 slices shown (2 of 4)]
[im 1/16]
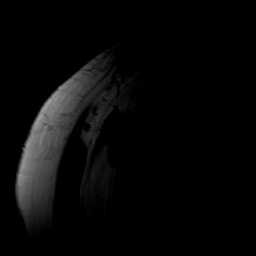
[im 4/16]
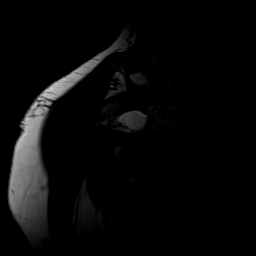
[im 7/16]
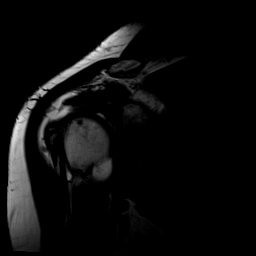
[im 10/16]
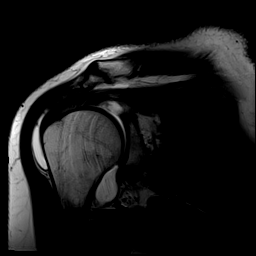
[im 13/16]
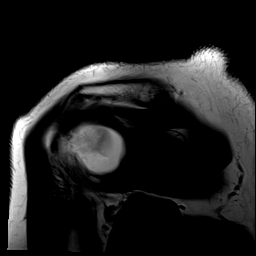
[im 16/16]
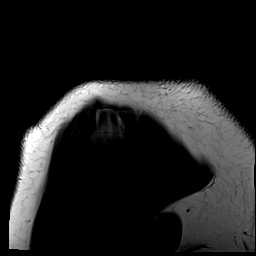

[Series 6: T1 fat-sat · sagittal · 4.0mm · 0.55mm/px · 6 of 16 slices shown (3 of 4)]
[im 1/16]
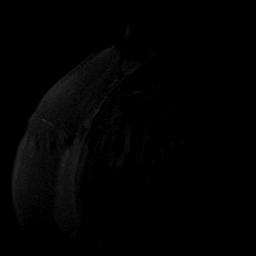
[im 4/16]
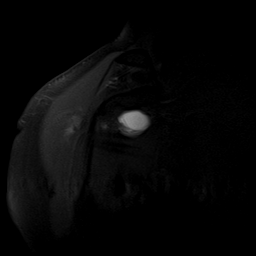
[im 7/16]
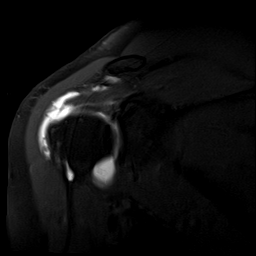
[im 10/16]
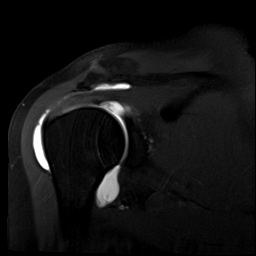
[im 13/16]
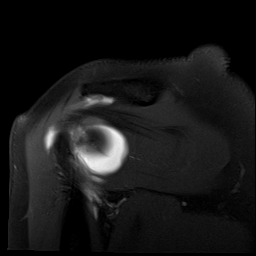
[im 16/16]
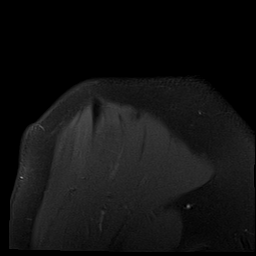

[Series 7: T2 fat-sat · sagittal · 4.0mm · 0.55mm/px · 6 of 16 slices shown (2 of 2)]
[im 1/16]
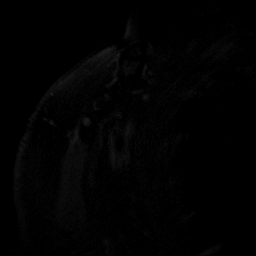
[im 4/16]
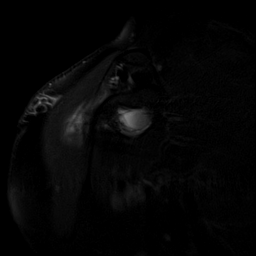
[im 7/16]
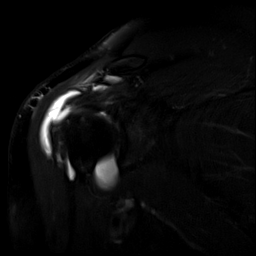
[im 10/16]
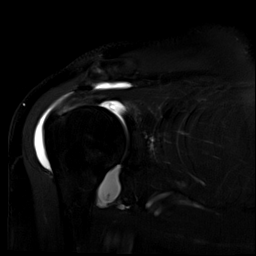
[im 13/16]
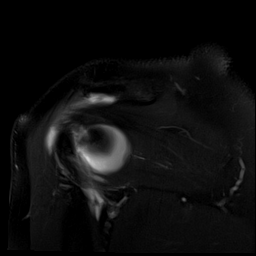
[im 16/16]
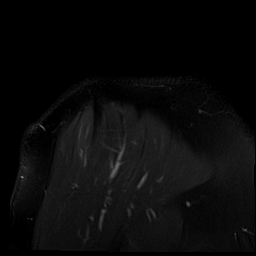

[Series 11: T1 fat-sat · sagittal · 4.0mm · 0.59mm/px · 6 of 16 slices shown (4 of 4)]
[im 1/16]
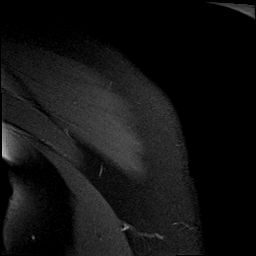
[im 4/16]
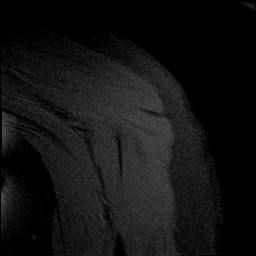
[im 7/16]
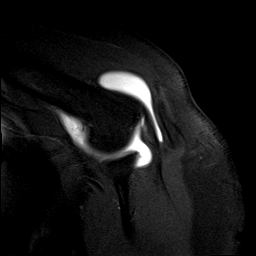
[im 10/16]
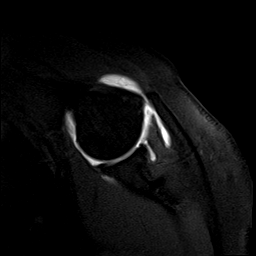
[im 13/16]
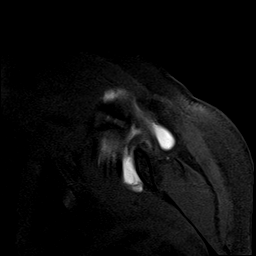
[im 16/16]
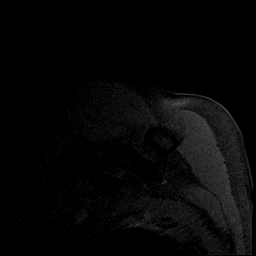

[40 of 40 positions shown; findings below may reference images not displayed]

FINDINGS: Technical note: Examination is slightly degraded by motion artifact.

Rotator cuff: Full-thickness tear of the distal supraspinatus tendon
anteriorly with up to 8 mm of tendinous retraction. Tear measures
approximately 8 mm in AP dimension. Infraspinatus and subscapularis
tendons intact with mild tendinosis. Teres minor unremarkable.

Muscles: Preserved bulk and signal intensity of the rotator cuff
musculature without edema, atrophy, or fatty infiltration.

Biceps long head: Intact.  Suspect mild intra-articular tendinosis.

Acromioclavicular Joint: Mild arthropathy of the AC joint. Injected
contrast extends into the subacromial-subdeltoid bursal space
through the rotator cuff tear.

Glenohumeral Joint: Glenohumeral joint is well distended with
injected contrast. Mild heterogeneity within the joint space
suggesting synovitis. No cartilage defect.

Labrum: Intact.

Bones: No acute fracture. No dislocation. No bone marrow edema. No
suspicious bone lesion.
IMPRESSION: 1. Full-thickness mildly retracted tear of the distal supraspinatus
tendon.
2. Mild intra-articular biceps tendinosis.
3. Mild AC joint arthropathy.

## 2021-05-14 ENCOUNTER — Other Ambulatory Visit (HOSPITAL_BASED_OUTPATIENT_CLINIC_OR_DEPARTMENT_OTHER): Payer: Self-pay

## 2021-05-14 MED FILL — Lisinopril & Hydrochlorothiazide Tab 10-12.5 MG: ORAL | 90 days supply | Qty: 90 | Fill #1 | Status: AC

## 2021-06-19 MED FILL — Atorvastatin Calcium Tab 40 MG (Base Equivalent): ORAL | 90 days supply | Qty: 90 | Fill #1 | Status: AC

## 2021-06-21 ENCOUNTER — Other Ambulatory Visit (HOSPITAL_BASED_OUTPATIENT_CLINIC_OR_DEPARTMENT_OTHER): Payer: Self-pay

## 2021-07-21 ENCOUNTER — Other Ambulatory Visit (HOSPITAL_BASED_OUTPATIENT_CLINIC_OR_DEPARTMENT_OTHER): Payer: Self-pay

## 2021-07-21 MED ORDER — PHENAZOPYRIDINE HCL 200 MG PO TABS
ORAL_TABLET | ORAL | 0 refills | Status: AC
Start: 2021-07-20 — End: ?
  Filled 2021-07-21: qty 9, 3d supply, fill #0

## 2021-07-21 MED ORDER — FLUCONAZOLE 150 MG PO TABS
ORAL_TABLET | ORAL | 0 refills | Status: AC
Start: 2021-07-20 — End: ?
  Filled 2021-07-21: qty 1, 1d supply, fill #0

## 2021-07-21 MED ORDER — CEFDINIR 300 MG PO CAPS
ORAL_CAPSULE | ORAL | 0 refills | Status: AC
Start: 2021-07-20 — End: ?
  Filled 2021-07-21: qty 10, 5d supply, fill #0

## 2021-08-06 ENCOUNTER — Other Ambulatory Visit (HOSPITAL_BASED_OUTPATIENT_CLINIC_OR_DEPARTMENT_OTHER): Payer: Self-pay

## 2021-08-06 MED ORDER — INFLUENZA VAC A&B SA ADJ QUAD 0.5 ML IM PRSY
PREFILLED_SYRINGE | INTRAMUSCULAR | 0 refills | Status: DC
  Filled 2021-08-06: qty 0.5, 1d supply, fill #0

## 2021-08-12 ENCOUNTER — Other Ambulatory Visit (HOSPITAL_BASED_OUTPATIENT_CLINIC_OR_DEPARTMENT_OTHER): Payer: Self-pay

## 2021-08-12 MED FILL — Lisinopril & Hydrochlorothiazide Tab 10-12.5 MG: ORAL | 90 days supply | Qty: 90 | Fill #2 | Status: AC

## 2021-08-26 ENCOUNTER — Ambulatory Visit: Payer: Managed Care, Other (non HMO) | Attending: Internal Medicine

## 2021-08-26 DIAGNOSIS — Z23 Encounter for immunization: Secondary | ICD-10-CM

## 2021-08-26 NOTE — Progress Notes (Signed)
   Covid-19 Vaccination Clinic  Name:  Donna Mercer    MRN: [id number] DOB: 02-20-1956  08/26/2021  Ms. Bethune was observed post Covid-19 immunization for 15 minutes without incident. She was provided with Vaccine Information Sheet and instruction to access the V-Safe system.   Ms. Moilanen was instructed to call 911 with any severe reactions post vaccine: Difficulty breathing  Swelling of face and throat  A fast heartbeat  A bad rash all over body  Dizziness and weakness   Immunizations Administered     Name Date Dose VIS Date Route   Pfizer Covid-19 Vaccine Bivalent Booster 08/26/2021 12:20 PM 0.3 mL 06/30/2021 Intramuscular   Manufacturer: [employer], [employer]   Lot: [id number]   NDC: [phone]

## 2021-09-20 ENCOUNTER — Other Ambulatory Visit (HOSPITAL_BASED_OUTPATIENT_CLINIC_OR_DEPARTMENT_OTHER): Payer: Self-pay

## 2021-09-20 MED ORDER — VALACYCLOVIR HCL 1 G PO TABS
ORAL_TABLET | ORAL | 4 refills | Status: AC
Start: 2021-09-20 — End: ?
  Filled 2021-09-20: qty 4, 2d supply, fill #0
  Filled 2022-09-06: qty 4, 2d supply, fill #1

## 2021-09-20 MED ORDER — PFIZER COVID-19 VAC BIVALENT 30 MCG/0.3ML IM SUSP
INTRAMUSCULAR | 0 refills | Status: AC
Start: 2021-08-26 — End: ?
  Filled 2021-09-20: qty 0.3, 1d supply, fill #0

## 2021-09-24 ENCOUNTER — Other Ambulatory Visit (HOSPITAL_BASED_OUTPATIENT_CLINIC_OR_DEPARTMENT_OTHER): Payer: Self-pay

## 2021-09-24 MED FILL — Atorvastatin Calcium Tab 40 MG (Base Equivalent): ORAL | 90 days supply | Qty: 90 | Fill #2 | Status: AC

## 2021-10-07 ENCOUNTER — Other Ambulatory Visit (HOSPITAL_BASED_OUTPATIENT_CLINIC_OR_DEPARTMENT_OTHER): Payer: Self-pay

## 2021-11-10 ENCOUNTER — Other Ambulatory Visit (HOSPITAL_BASED_OUTPATIENT_CLINIC_OR_DEPARTMENT_OTHER): Payer: Self-pay

## 2021-11-10 MED ORDER — LISINOPRIL-HYDROCHLOROTHIAZIDE 10-12.5 MG PO TABS
1.0000 | ORAL_TABLET | Freq: Every day | ORAL | 0 refills | Status: DC
  Filled 2021-11-10: qty 90, 90d supply, fill #0

## 2021-11-29 ENCOUNTER — Other Ambulatory Visit (HOSPITAL_BASED_OUTPATIENT_CLINIC_OR_DEPARTMENT_OTHER): Payer: Self-pay

## 2021-11-29 MED ORDER — ATORVASTATIN CALCIUM 40 MG PO TABS
ORAL_TABLET | ORAL | 3 refills | Status: DC
  Filled 2021-12-30: qty 90, 90d supply, fill #0
  Filled 2022-03-29: qty 90, 90d supply, fill #1
  Filled 2022-07-02: qty 90, 90d supply, fill #2
  Filled 2022-10-07: qty 90, 90d supply, fill #3

## 2021-11-29 MED ORDER — LISINOPRIL-HYDROCHLOROTHIAZIDE 10-12.5 MG PO TABS: 1.0000 | ORAL_TABLET | Freq: Every day | ORAL | 3 refills | Status: DC

## 2021-12-10 ENCOUNTER — Other Ambulatory Visit (HOSPITAL_BASED_OUTPATIENT_CLINIC_OR_DEPARTMENT_OTHER): Payer: Self-pay

## 2021-12-10 MED ORDER — TETANUS-DIPHTH-ACELL PERTUSSIS 5-2.5-18.5 LF-MCG/0.5 IM SUSY
PREFILLED_SYRINGE | INTRAMUSCULAR | 0 refills | Status: DC
  Filled 2021-12-10: qty 0.5, 1d supply, fill #0

## 2021-12-20 ENCOUNTER — Other Ambulatory Visit (HOSPITAL_BASED_OUTPATIENT_CLINIC_OR_DEPARTMENT_OTHER): Payer: Self-pay

## 2021-12-31 ENCOUNTER — Other Ambulatory Visit (HOSPITAL_BASED_OUTPATIENT_CLINIC_OR_DEPARTMENT_OTHER): Payer: Self-pay

## 2022-02-07 ENCOUNTER — Other Ambulatory Visit (HOSPITAL_BASED_OUTPATIENT_CLINIC_OR_DEPARTMENT_OTHER): Payer: Self-pay

## 2022-02-07 MED ORDER — LISINOPRIL-HYDROCHLOROTHIAZIDE 10-12.5 MG PO TABS
1.0000 | ORAL_TABLET | Freq: Every day | ORAL | 1 refills | Status: DC
  Filled 2022-02-07: qty 90, 90d supply, fill #0

## 2022-03-30 ENCOUNTER — Other Ambulatory Visit (HOSPITAL_BASED_OUTPATIENT_CLINIC_OR_DEPARTMENT_OTHER): Payer: Self-pay

## 2022-04-25 ENCOUNTER — Other Ambulatory Visit (HOSPITAL_COMMUNITY): Payer: Self-pay

## 2022-05-06 ENCOUNTER — Other Ambulatory Visit (HOSPITAL_BASED_OUTPATIENT_CLINIC_OR_DEPARTMENT_OTHER): Payer: Self-pay

## 2022-05-06 MED ORDER — LISINOPRIL-HYDROCHLOROTHIAZIDE 10-12.5 MG PO TABS
1.0000 | ORAL_TABLET | Freq: Every day | ORAL | 1 refills | Status: DC
  Filled 2022-05-06: qty 90, 90d supply, fill #0

## 2022-05-14 DIAGNOSIS — Z1231 Encounter for screening mammogram for malignant neoplasm of breast: Secondary | ICD-10-CM | POA: Diagnosis not present

## 2022-05-31 DIAGNOSIS — I10 Essential (primary) hypertension: Secondary | ICD-10-CM | POA: Diagnosis not present

## 2022-05-31 DIAGNOSIS — E785 Hyperlipidemia, unspecified: Secondary | ICD-10-CM | POA: Diagnosis not present

## 2022-05-31 DIAGNOSIS — Z23 Encounter for immunization: Secondary | ICD-10-CM | POA: Diagnosis not present

## 2022-05-31 DIAGNOSIS — R7303 Prediabetes: Secondary | ICD-10-CM | POA: Diagnosis not present

## 2022-07-04 ENCOUNTER — Other Ambulatory Visit (HOSPITAL_BASED_OUTPATIENT_CLINIC_OR_DEPARTMENT_OTHER): Payer: Self-pay

## 2022-07-05 ENCOUNTER — Other Ambulatory Visit (HOSPITAL_BASED_OUTPATIENT_CLINIC_OR_DEPARTMENT_OTHER): Payer: Self-pay

## 2022-07-21 ENCOUNTER — Other Ambulatory Visit (HOSPITAL_BASED_OUTPATIENT_CLINIC_OR_DEPARTMENT_OTHER): Payer: Self-pay

## 2022-07-21 MED ORDER — FLUAD QUADRIVALENT 0.5 ML IM PRSY
PREFILLED_SYRINGE | INTRAMUSCULAR | 0 refills | Status: DC
  Filled 2022-07-21: qty 0.5, 1d supply, fill #0

## 2022-08-04 DIAGNOSIS — H52223 Regular astigmatism, bilateral: Secondary | ICD-10-CM | POA: Diagnosis not present

## 2022-08-09 ENCOUNTER — Other Ambulatory Visit (HOSPITAL_BASED_OUTPATIENT_CLINIC_OR_DEPARTMENT_OTHER): Payer: Self-pay

## 2022-08-09 MED ORDER — LISINOPRIL-HYDROCHLOROTHIAZIDE 10-12.5 MG PO TABS
1.0000 | ORAL_TABLET | Freq: Every day | ORAL | 1 refills | Status: DC
  Filled 2022-08-09: qty 90, 90d supply, fill #0
  Filled 2022-11-03: qty 90, 90d supply, fill #1

## 2022-09-06 ENCOUNTER — Other Ambulatory Visit (HOSPITAL_BASED_OUTPATIENT_CLINIC_OR_DEPARTMENT_OTHER): Payer: Self-pay

## 2022-10-20 ENCOUNTER — Other Ambulatory Visit (HOSPITAL_BASED_OUTPATIENT_CLINIC_OR_DEPARTMENT_OTHER): Payer: Self-pay

## 2022-10-20 DIAGNOSIS — I10 Essential (primary) hypertension: Secondary | ICD-10-CM | POA: Diagnosis not present

## 2022-10-20 DIAGNOSIS — B001 Herpesviral vesicular dermatitis: Secondary | ICD-10-CM | POA: Diagnosis not present

## 2022-10-20 MED ORDER — VALACYCLOVIR HCL 1 G PO TABS
ORAL_TABLET | ORAL | 0 refills | Status: AC
  Filled 2022-10-20: qty 30, 7d supply, fill #0

## 2022-10-21 ENCOUNTER — Other Ambulatory Visit (HOSPITAL_BASED_OUTPATIENT_CLINIC_OR_DEPARTMENT_OTHER): Payer: Self-pay

## 2022-11-03 ENCOUNTER — Other Ambulatory Visit (HOSPITAL_BASED_OUTPATIENT_CLINIC_OR_DEPARTMENT_OTHER): Payer: Self-pay

## 2022-11-30 DIAGNOSIS — E785 Hyperlipidemia, unspecified: Secondary | ICD-10-CM | POA: Diagnosis not present

## 2022-11-30 DIAGNOSIS — Z0000 Encounter for general adult medical examination without abnormal findings: Secondary | ICD-10-CM | POA: Diagnosis not present

## 2022-11-30 DIAGNOSIS — I10 Essential (primary) hypertension: Secondary | ICD-10-CM | POA: Diagnosis not present

## 2022-11-30 DIAGNOSIS — R7303 Prediabetes: Secondary | ICD-10-CM | POA: Diagnosis not present

## 2023-01-06 ENCOUNTER — Other Ambulatory Visit (HOSPITAL_BASED_OUTPATIENT_CLINIC_OR_DEPARTMENT_OTHER): Payer: Self-pay

## 2023-01-06 MED ORDER — ATORVASTATIN CALCIUM 40 MG PO TABS
40.0000 mg | ORAL_TABLET | Freq: Every day | ORAL | 3 refills | Status: DC
  Filled 2023-01-06: qty 90, 90d supply, fill #0
  Filled 2023-03-30: qty 90, 90d supply, fill #1

## 2023-01-09 ENCOUNTER — Other Ambulatory Visit (HOSPITAL_BASED_OUTPATIENT_CLINIC_OR_DEPARTMENT_OTHER): Payer: Self-pay

## 2023-01-19 DIAGNOSIS — K080 Exfoliation of teeth due to systemic causes: Secondary | ICD-10-CM | POA: Diagnosis not present

## 2023-01-31 ENCOUNTER — Other Ambulatory Visit (HOSPITAL_BASED_OUTPATIENT_CLINIC_OR_DEPARTMENT_OTHER): Payer: Self-pay

## 2023-01-31 MED ORDER — LISINOPRIL-HYDROCHLOROTHIAZIDE 10-12.5 MG PO TABS
1.0000 | ORAL_TABLET | Freq: Every day | ORAL | 3 refills | Status: DC
  Filled 2023-01-31: qty 12, 12d supply, fill #0
  Filled 2023-01-31: qty 90, 90d supply, fill #0
  Filled 2023-02-01: qty 78, 78d supply, fill #0

## 2023-02-01 ENCOUNTER — Other Ambulatory Visit (HOSPITAL_BASED_OUTPATIENT_CLINIC_OR_DEPARTMENT_OTHER): Payer: Self-pay

## 2023-02-01 ENCOUNTER — Other Ambulatory Visit: Payer: Self-pay

## 2023-04-28 ENCOUNTER — Other Ambulatory Visit (HOSPITAL_BASED_OUTPATIENT_CLINIC_OR_DEPARTMENT_OTHER): Payer: Self-pay

## 2023-04-28 MED ORDER — LISINOPRIL-HYDROCHLOROTHIAZIDE 10-12.5 MG PO TABS
1.0000 | ORAL_TABLET | Freq: Every day | ORAL | 3 refills | Status: DC
  Filled 2023-04-28: qty 90, 90d supply, fill #0

## 2023-05-29 DIAGNOSIS — R29890 Loss of height: Secondary | ICD-10-CM | POA: Diagnosis not present

## 2023-05-29 DIAGNOSIS — Z1231 Encounter for screening mammogram for malignant neoplasm of breast: Secondary | ICD-10-CM | POA: Diagnosis not present

## 2023-05-29 DIAGNOSIS — M8588 Other specified disorders of bone density and structure, other site: Secondary | ICD-10-CM | POA: Diagnosis not present

## 2023-05-29 LAB — HM DEXA SCAN

## 2023-05-29 LAB — HM MAMMOGRAPHY

## 2023-05-31 DIAGNOSIS — M8580 Other specified disorders of bone density and structure, unspecified site: Secondary | ICD-10-CM | POA: Insufficient documentation

## 2023-06-27 DIAGNOSIS — R7303 Prediabetes: Secondary | ICD-10-CM | POA: Diagnosis not present

## 2023-06-27 DIAGNOSIS — E785 Hyperlipidemia, unspecified: Secondary | ICD-10-CM | POA: Diagnosis not present

## 2023-06-27 DIAGNOSIS — I10 Essential (primary) hypertension: Secondary | ICD-10-CM | POA: Diagnosis not present

## 2023-07-05 ENCOUNTER — Other Ambulatory Visit (HOSPITAL_BASED_OUTPATIENT_CLINIC_OR_DEPARTMENT_OTHER): Payer: Self-pay

## 2023-07-05 MED ORDER — ATORVASTATIN CALCIUM 40 MG PO TABS
40.0000 mg | ORAL_TABLET | Freq: Every day | ORAL | 3 refills | Status: DC
  Filled 2023-07-05: qty 90, 90d supply, fill #0
  Filled 2023-10-08: qty 90, 90d supply, fill #1
  Filled 2024-01-08: qty 90, 90d supply, fill #2
  Filled 2024-04-05: qty 90, 90d supply, fill #3

## 2023-07-20 DIAGNOSIS — K080 Exfoliation of teeth due to systemic causes: Secondary | ICD-10-CM | POA: Diagnosis not present

## 2023-07-31 ENCOUNTER — Other Ambulatory Visit (HOSPITAL_BASED_OUTPATIENT_CLINIC_OR_DEPARTMENT_OTHER): Payer: Self-pay

## 2023-07-31 MED ORDER — LISINOPRIL-HYDROCHLOROTHIAZIDE 10-12.5 MG PO TABS
1.0000 | ORAL_TABLET | Freq: Every day | ORAL | 3 refills | Status: DC
  Filled 2023-07-31: qty 90, 90d supply, fill #0
  Filled 2023-10-27: qty 90, 90d supply, fill #1
  Filled 2024-01-24: qty 90, 90d supply, fill #2
  Filled 2024-04-23: qty 90, 90d supply, fill #3

## 2023-08-01 ENCOUNTER — Other Ambulatory Visit (HOSPITAL_BASED_OUTPATIENT_CLINIC_OR_DEPARTMENT_OTHER): Payer: Self-pay

## 2023-08-02 ENCOUNTER — Ambulatory Visit: Payer: Managed Care, Other (non HMO) | Admitting: Family Medicine

## 2023-08-02 ENCOUNTER — Encounter: Payer: Self-pay | Admitting: Family Medicine

## 2023-08-02 VITALS — BP 133/58 | HR 88 | Ht 63.0 in | Wt 153.0 lb

## 2023-08-02 DIAGNOSIS — E785 Hyperlipidemia, unspecified: Secondary | ICD-10-CM | POA: Diagnosis not present

## 2023-08-02 DIAGNOSIS — M8580 Other specified disorders of bone density and structure, unspecified site: Secondary | ICD-10-CM

## 2023-08-02 DIAGNOSIS — I10 Essential (primary) hypertension: Secondary | ICD-10-CM | POA: Diagnosis not present

## 2023-08-02 DIAGNOSIS — R7303 Prediabetes: Secondary | ICD-10-CM

## 2023-08-02 LAB — HEMOGLOBIN A1C: Hgb A1c MFr Bld: 6.2 % (ref 4.6–6.5)

## 2023-08-02 LAB — LIPID PANEL
Cholesterol: 198 mg/dL (ref 0–200)
HDL: 72.1 mg/dL (ref >39.00)
LDL Cholesterol: 94 mg/dL (ref 0–99)
NonHDL: 126.12
Total CHOL/HDL Ratio: 3
Triglycerides: 162 mg/dL — ABNORMAL HIGH (ref 0.0–149.0)
VLDL: 32.4 mg/dL (ref 0.0–40.0)

## 2023-08-02 LAB — COMPREHENSIVE METABOLIC PANEL
ALT: 16 U/L (ref 0–35)
AST: 19 U/L (ref 0–37)
Albumin: 4.7 g/dL (ref 3.5–5.2)
Alkaline Phosphatase: 59 U/L (ref 39–117)
BUN: 24 mg/dL — ABNORMAL HIGH (ref 6–23)
CO2: 29 meq/L (ref 19–32)
Calcium: 9.7 mg/dL (ref 8.4–10.5)
Chloride: 101 meq/L (ref 96–112)
Creatinine, Ser: 0.92 mg/dL (ref 0.40–1.20)
GFR: 64.2 mL/min (ref >60.00)
Glucose, Bld: 97 mg/dL (ref 70–99)
Potassium: 4.4 meq/L (ref 3.5–5.1)
Sodium: 139 meq/L (ref 135–145)
Total Bilirubin: 0.5 mg/dL (ref 0.2–1.2)
Total Protein: 7.2 g/dL (ref 6.0–8.3)

## 2023-08-02 LAB — TSH: TSH: 1.57 u[IU]/mL (ref 0.35–5.50)

## 2023-08-02 LAB — CBC WITH DIFFERENTIAL/PLATELET
Basophils Absolute: 0 10*3/uL (ref 0.0–0.1)
Basophils Relative: 0.6 % (ref 0.0–3.0)
Eosinophils Absolute: 0.1 10*3/uL (ref 0.0–0.7)
Eosinophils Relative: 2.4 % (ref 0.0–5.0)
HCT: 38.1 % (ref 36.0–46.0)
Hemoglobin: 12.6 g/dL (ref 12.0–15.0)
Lymphocytes Relative: 35.8 % (ref 12.0–46.0)
Lymphs Abs: 1.5 10*3/uL (ref 0.7–4.0)
MCHC: 33 g/dL (ref 30.0–36.0)
MCV: 91.5 fL (ref 78.0–100.0)
Monocytes Absolute: 0.5 10*3/uL (ref 0.1–1.0)
Monocytes Relative: 12.4 % — ABNORMAL HIGH (ref 3.0–12.0)
Neutro Abs: 2 10*3/uL (ref 1.4–7.7)
Neutrophils Relative %: 48.8 % (ref 43.0–77.0)
Platelets: 325 10*3/uL (ref 150.0–400.0)
RBC: 4.16 Mil/uL (ref 3.87–5.11)
RDW: 13.5 % (ref 11.5–15.5)
WBC: 4.2 10*3/uL (ref 4.0–10.5)

## 2023-08-02 LAB — VITAMIN D 25 HYDROXY (VIT D DEFICIENCY, FRACTURES): VITD: 52.78 ng/mL (ref 30.00–100.00)

## 2023-08-02 NOTE — Assessment & Plan Note (Signed)
Blood pressure is at goal for age and co-morbidities.   Recommendations: continue lisinopril-hctz 10-12.5 mg daily - BP goal <130/80 - monitor and log blood pressures at home - check around the same time each day in a relaxed setting - Limit salt to <2000 mg/day - Follow DASH eating plan (heart healthy diet) - limit alcohol to 2 standard drinks per day for men and 1 per day for women - avoid tobacco products - get at least 2 hours of regular aerobic exercise weekly Patient aware of signs/symptoms requiring further/urgent evaluation. Labs updated today.

## 2023-08-02 NOTE — Progress Notes (Signed)
New Patient Office Visit  Subjective    Patient ID: BAYA LENTZ, female    DOB: [birth year]  Age: 67 y.o. MRN: [id number]  CC:  Chief Complaint  Patient presents with   Establish Care    HPI Donna Mercer presents to establish care. She was previously at [facility]. Her husband is a patient here with one of our other providers, so she wanted to transfer here as well. She is a Information systems manager in our Therapist, [employer].    Discussed the use of AI scribe software for clinical note transcription with the patient, who gave verbal consent to proceed.  History of Present Illness   The patient, previously seen at [facility], presents as a new patient with a history of hyperlipidemia, hypertension, prediabetes, and osteopenia. She reports good tolerance to their current regimen of atorvastatin 40mg  nightly and a combination pill of lisinopril and hydrochlorothiazide 10/12.5mg  daily. She also takes a calcium and vitamin D combination supplement and a multivitamin for over 50s.  The patient's last cholesterol check is unknown, but she reports no side effects from the atorvastatin. She occasionally monitors their blood pressure at home, which is usually normal, but she noted a recent abnormal reading which she attributes to stress.  The patient follows a general diet with a focus on limiting carbohydrate intake due to their prediabetic status and their spouse's diabetes. She reports their last A1c was 6.1, down from 6.4, which was checked in late January.  For their osteopenia, she takes a calcium and vitamin D combination supplement. Their last DEXA scan was earlier this year, showing a slight change but not significant enough to warrant medication.  The patient also reports having a supply of Valtrex on hand for occasional herpes simplex outbreaks, which she can usually predict by a tingling sensation in their lip. She requests a refill when their supply runs low.  The patient's last colonoscopy was  six years ago with a ten-year follow-up due to normal results. Her last mammogram was done earlier this year on the same day as their bone density scan.  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 She reports a recent improvement in her mood.        Diabetes: - Checking glucose at home: no - Medications: none, lifestyle measures only - Compliance: n/a - Diet: low carb  - Exercise: walking daily  - Denies symptoms of hypoglycemia, polyuria, polydipsia, numbness extremities, foot ulcers/trauma, wounds that are not healing, medication side effects  - Most recently 6.1% earlier this year   Hypertension: - Medications: lisinopril-hctz 10-12.5. Mg daily - Compliance: good - Checking BP at home: occasionally, usually normal - Denies any SOB, recurrent headaches, CP, vision changes, LE edema, dizziness, palpitations, or medication side effects. - Diet: general, low-carb - Exercise: walking   Hyperlipidemia: - medications: atorvastatin 40 mg daily - compliance: good - medication SEs: none The 10-year ASCVD risk score (Arnett DK, et al., 2019) is: 10.3%   Values used to calculate the score:     Age: 36 years     Sex: Female     Is Non-Hispanic African American: No     Diabetic: No     Tobacco smoker: No     Systolic Blood Pressure: 133 mmHg     Is BP treated: Yes     HDL Cholesterol: 70 mg/dL     Total Cholesterol: 200 mg/dL  Osteopenia: - Currently taking OTC Calcium-Vitamin D supplement  - Done this year at [facility] on [address] (ordered by Smitty Cords - will look for/request records)     Outpatient Encounter Medications as of 08/02/2023  Medication Sig   atorvastatin (LIPITOR) 40 MG tablet Take 1 tablet (40 mg total) by mouth daily.   CALCIUM-VITAMIN D PO Take by mouth daily.   lisinopril-hydrochlorothiazide (ZESTORETIC) 10-12.5 MG tablet Take 1 tablet by mouth daily.   Multiple  Vitamins-Minerals (MULTI FOR HER 50+) TABS Take by mouth. Centrum Silver   valACYclovir (VALTREX) 1000 MG tablet Take two tablets at first sign of cold sore and repeat once in 12 hours. (Patient not taking: Reported on 08/02/2023)   [DISCONTINUED] atorvastatin (LIPITOR) 20 MG tablet Take 20 mg by mouth daily.   [DISCONTINUED] atorvastatin (LIPITOR) 40 MG tablet Take one tablet (40 mg dose) by mouth daily.   [DISCONTINUED] atorvastatin (LIPITOR) 40 MG tablet Take 1 tablet (40 mg total) by mouth daily.   [DISCONTINUED] cefdinir (OMNICEF) 300 MG capsule Take 1 capsule by mouth 2 times daily for 5 days   [DISCONTINUED] COVID-19 mRNA bivalent vaccine, Pfizer, (PFIZER COVID-19 VAC BIVALENT) injection Inject into the muscle.   [DISCONTINUED] COVID-19 mRNA Vac-TriS, Pfizer, (PFIZER-BIONT COVID-19 VAC-TRIS) SUSP injection Inject into the muscle.   [DISCONTINUED] fluconazole (DIFLUCAN) 150 MG tablet Take 1 tablet by mouth once as needed for vaginal yeast infection   [DISCONTINUED] influenza vaccine adjuvanted (FLUAD QUADRIVALENT) 0.5 ML injection Inject into the muscle.   [DISCONTINUED] influenza vaccine adjuvanted (FLUAD) 0.5 ML injection Inject into the muscle.   [DISCONTINUED] lisinopril-hydrochlorothiazide (ZESTORETIC) 10-12.5 MG tablet TAKE 1 TABLET BY MOUTH DAILY   [DISCONTINUED] lisinopril-hydrochlorothiazide (ZESTORETIC) 10-12.5 MG tablet Take one tablet by mouth daily.   [DISCONTINUED] lisinopril-hydrochlorothiazide (ZESTORETIC) 10-12.5 MG tablet Take 1 tablet by mouth daily.   [DISCONTINUED] lisinopril-hydrochlorothiazide (ZESTORETIC) 10-12.5 MG tablet Take one tablet by mouth daily.   [DISCONTINUED] lisinopril-hydrochlorothiazide (ZESTORETIC) 10-12.5 MG tablet Take 1 tablet by mouth daily.   [DISCONTINUED] lisinopril-hydrochlorothiazide (ZESTORETIC) 10-12.5 MG tablet Take 1 tablet by mouth daily.   [DISCONTINUED] lisinopril-hydrochlorothiazide (ZESTORETIC) 10-12.5 MG tablet Take one tablet by mouth  daily.   [DISCONTINUED] phenazopyridine (PYRIDIUM) 200 MG tablet Take 1 tablet by mouth 3 times daily as needed for pain   [DISCONTINUED] simvastatin (ZOCOR) 40 MG tablet TAKE 1 TABLET BY MOUTH AT BEDTIME   [DISCONTINUED] Tdap (BOOSTRIX) 5-2.5-18.5 LF-MCG/0.5 injection Inject into the muscle.   [DISCONTINUED] valACYclovir (VALTREX) 1000 MG tablet Take 2 tablets by mouth every 12 hours   No facility-administered encounter medications on file as of 08/02/2023.    Past Medical History:  Diagnosis Date   Hyperlipidemia    Hypertension    Osteopenia     Past Surgical History:  Procedure Laterality Date   BUNIONECTOMY Left    CESAREAN SECTION     MANDIBLE SURGERY  1990    Family History  Problem Relation Age of Onset   Hyperlipidemia Mother    Heart attack Father    Breast cancer Maternal Grandmother    Breast cancer Maternal Aunt     Social History   Socioeconomic History   Marital status: Married    Spouse name: Not on file   Number of children: Not on file   Years of education: Not on file   Highest education level: Not on file  Occupational History   Not on file  Tobacco Use   Smoking status: Never   Smokeless tobacco: Never  Substance and Sexual Activity  Alcohol use: Yes    Comment: rarely   Drug use: Never   Sexual activity: Not on file  Other Topics Concern   Not on file  Social History Narrative   Not on file   Social Determinants of Health   Financial Resource Strain: Low Risk  (06/24/2023)   Received from [facility]   Overall Financial Resource Strain (CARDIA)    Difficulty of Paying Living Expenses: Not hard at all  Food Insecurity: No Food Insecurity (06/24/2023)   [facility]   Hunger Vital Sign    Worried About Running Out of Food in the Last Year: Never true    Ran Out of Food in the Last Year: Never true  Transportation Needs: No Transportation Needs (06/24/2023)   Received from [facility] - Transportation     Lack of Transportation (Medical): No    Lack of Transportation (Non-Medical): No  Physical Activity: Insufficiently Active (06/24/2023)   Received from [facility]   Exercise Vital Sign    Days of Exercise per Week: 3 days    Minutes of Exercise per Session: 20 min  Stress: No Stress Concern Present (06/24/2023)   Received from [facility] - Occupational Stress Questionnaire    Feeling of Stress : Only a little  Social Connections: Socially Integrated (06/24/2023)   Received from [facility]   Social Network    How would you rate your social network (family, work, friends)?: Good participation with social networks  Intimate Partner Violence: Not At Risk (06/24/2023)   Received from [facility]   HITS    Over the last 12 months how often did your partner physically hurt you?: 1    Over the last 12 months how often did your partner insult you or talk down to you?: 1    Over the last 12 months how often did your partner threaten you with physical harm?: 1    Over the last 12 months how often did your partner scream or curse at you?: 2    ROS All review of systems negative except what is listed in the HPI      Objective    BP (!) 133/58   Pulse 88   Ht 5\' 3"  (1.6 m)   Wt 153 lb (69.4 kg)   SpO2 100%   BMI 27.10 kg/m   Physical Exam Vitals reviewed.  Constitutional:      Appearance: Normal appearance.  Cardiovascular:     Rate and Rhythm: Normal rate and regular rhythm.     Heart sounds: Normal heart sounds.  Pulmonary:     Effort: Pulmonary effort is normal.     Breath sounds: Normal breath sounds.  Skin:    General: Skin is warm and dry.  Neurological:     Mental Status: She is alert and oriented to person, place, and time.  Psychiatric:        Mood and Affect: Mood normal.        Behavior: Behavior normal.        Thought Content: Thought content normal.        Judgment: Judgment normal.         Assessment &  Plan:   Problem List Items Addressed This Visit       Active Problems   HLD (hyperlipidemia) - Primary    Medication management: atorvastatin 40 mg daily Lifestyle factors for lowering cholesterol include: Diet therapy - heart-healthy diet  rich in fruits, veggies, fiber-rich whole grains, lean meats, chicken, fish (at least twice a week), fat-free or 1% dairy products; foods low in saturated/trans fats, cholesterol, sodium, and sugar. Mediterranean diet has shown to be very heart healthy. Regular exercise - recommend at least 30 minutes a day, 5 times per week Weight management  Repeat CMP and lipid panel today       Relevant Orders   Comprehensive metabolic panel   Lipid panel   Hypertension    Blood pressure is at goal for age and co-morbidities.   Recommendations: continue lisinopril-hctz 10-12.5 mg daily - BP goal <130/80 - monitor and log blood pressures at home - check around the same time each day in a relaxed setting - Limit salt to <2000 mg/day - Follow DASH eating plan (heart healthy diet) - limit alcohol to 2 standard drinks per day for men and 1 per day for women - avoid tobacco products - get at least 2 hours of regular aerobic exercise weekly Patient aware of signs/symptoms requiring further/urgent evaluation. Labs updated today.       Relevant Orders   CBC with Differential/Platelet   Comprehensive metabolic panel   Lipid panel   TSH   Prediabetes    Last HbA1c was 6.1 several months ago, down from 6.4 per patient. Reports general diet with attention to carb intake and regular walking for exercise. -Order HbA1c to assess current status.      Relevant Orders   Comprehensive metabolic panel   Hemoglobin A1c   Osteopenia    On Calcium-Vitamin D combo and multivitamin. Last DEXA scan was earlier this year with slight change but not requiring medication. -Continue current medication regimen.      Relevant Orders   Comprehensive metabolic panel    VITAMIN D 25 Hydroxy (Vit-D Deficiency, Fractures)    Return in about 6 months (around 01/31/2024) for routine follow-up.   Donna Dana, NP

## 2023-08-02 NOTE — Assessment & Plan Note (Signed)
On Calcium-Vitamin D combo and multivitamin. Last DEXA scan was earlier this year with slight change but not requiring medication. -Continue current medication regimen.

## 2023-08-02 NOTE — Assessment & Plan Note (Signed)
Last HbA1c was 6.1 several months ago, down from 6.4 per patient. Reports general diet with attention to carb intake and regular walking for exercise. -Order HbA1c to assess current status.

## 2023-08-02 NOTE — Patient Instructions (Signed)
Thank you for choosing [facility] for your Primary Care needs. I am excited for the opportunity to partner with you to meet your health care goals. It was a pleasure meeting you today!  Information on diet, exercise, and health maintenance recommendations are listed below. This is information to help you be sure you are on track for optimal health and monitoring.   Please look over this and let us know if you have any questions or if you have completed any of the health maintenance outside of [place] so that we can be sure your records are up to date.  ___________________________________________________________  MyChart:  For all urgent or time sensitive needs we ask that you please call the office to avoid delays. Our number is [phone]. MyChart is not constantly monitored and due to the large volume of messages a day, replies may take up to 72 business hours.  MyChart Policy: MyChart allows for you to see your visit notes, after visit summary, provider recommendations, lab and tests results, make an appointment, request refills, and contact your provider or the office for non-urgent questions or concerns. Providers are seeing patients during normal business hours and do not have built in time to review MyChart messages.  We ask that you allow a minimum of 3 business days for responses to MyChart messages. For this reason, please do not send urgent requests through MyChart. Please call the office at [phone]. New and ongoing conditions may require a visit. We have virtual and in-person visits available for your convenience.  Complex MyChart concerns may require a visit. Your provider may request you schedule a virtual or in-person visit to ensure we are providing the best care possible. MyChart messages sent after 11:00 AM on Friday will not be received by the provider until Monday morning.    Lab and Test Results: You will receive your lab and test  results on MyChart as soon as they are completed and results have been sent by the lab or testing facility. Due to this service, you will receive your results BEFORE your provider.  I review lab and test results each morning prior to seeing patients. Some results require collaboration with other providers to ensure you are receiving the most appropriate care. For this reason, we ask that you please allow a minimum of 3-5 business days from the time that ALL results have been received for your provider to receive and review lab and test results and contact you about these.  Most lab and test result comments from the provider will be sent through MyChart. Your provider may recommend changes to the plan of care, follow-up visits, repeat testing, ask questions, or request an office visit to discuss these results. You may reply directly to this message or call the office to provide information for the provider or set up an appointment. In some instances, you will be called with test results and recommendations. Please let us know if this is preferred and we will make note of this in your chart to provide this for you.    If you have not heard a response to your lab or test results in 5 business days from all results returning to MyChart, please call the office to let us know. We ask that you please avoid calling prior to this time unless there is an emergent concern. Due to high call volumes, this can delay the resulting process.  After Hours: For all non-emergency after hours needs, please   call the office at [phone] and select the option to reach the on-call  service. On-call services are shared between multiple [facility] offices and therefore it will not be possible to speak directly with your provider. On-call providers may provide medical advice and recommendations, but are unable to provide refills for maintenance medications.  For all emergency or urgent medical needs after normal business hours, we  recommend that you seek care at the closest Urgent Care or Emergency Department to ensure appropriate treatment in a timely manner.  [facility] has a 24 hour emergency room located on the ground floor for your convenience.   Urgent Concerns During the Business Day Providers are seeing patients from 8AM to 5PM with a busy schedule and are most often not able to respond to non-urgent calls until the end of the day or the next business day. If you should have URGENT concerns during the day, please call and speak to the nurse or schedule a same day appointment so that we can address your concern without delay.   Thank you, again, for choosing me as your health care partner. I appreciate your trust and look forward to learning more about you!   Darcey Demma B. Nhi Butrum, DNP, FNP-C  ___________________________________________________________  Health Maintenance Recommendations Screening Testing Mammogram Every 1-2 years based on history and risk factors Starting at age 50 Pap Smear Ages 21-39 every 3 years Ages 30-65 every 5 years with HPV testing More frequent testing may be required based on results and history Colon Cancer Screening Every 1-10 years based on test performed, risk factors, and history Starting at age 45 Bone Density Screening Every 2-10 years based on history Starting at age 65 for women Recommendations for men differ based on medication usage, history, and risk factors AAA Screening One time ultrasound Men 65-75 years old who have ever smoked Lung Cancer Screening Low Dose Lung CT every 12 months Age 50-80 years with a 20 pack-year smoking history who still smoke or who have quit within the last 15 years  Screening Labs Routine  Labs: Complete Blood Count (CBC), Complete Metabolic Panel (CMP), Cholesterol (Lipid Panel) Every 6-12 months based on history and medications May be recommended more frequently based on current conditions or previous results Hemoglobin  A1c Lab Every 3-12 months based on history and previous results Starting at age 45 or earlier with diagnosis of diabetes, high cholesterol, BMI >26, and/or risk factors Frequent monitoring for patients with diabetes to ensure blood sugar control Thyroid Panel  Every 6 months based on history, symptoms, and risk factors May be repeated more often if on medication HIV One time testing for all patients 13 and older May be repeated more frequently for patients with increased risk factors or exposure Hepatitis C One time testing for all patients 18 and older May be repeated more frequently for patients with increased risk factors or exposure Gonorrhea, Chlamydia Every 12 months for all sexually active persons 13-24 years Additional monitoring may be recommended for those who are considered high risk or who have symptoms PSA Men 40-54 years old with risk factors Additional screening may be recommended from age 55-69 based on risk factors, symptoms, and history  Vaccine Recommendations Tetanus Booster All adults every 10 years Flu Vaccine All patients 6 months and older every year COVID Vaccine All patients 12 years and older Initial dosing with booster May recommend additional booster based on age and health history HPV Vaccine 2 doses all patients age 9-26 Dosing may be considered   for patients over 26 Shingles Vaccine (Shingrix) 2 doses all adults 50 years and older Pneumonia (Pneumovax 23) All adults 65 years and older May recommend earlier dosing based on health history Pneumonia (Prevnar 13) All adults 65 years and older Dosed 1 year after Pneumovax 23 Pneumonia (Prevnar 20) All adults 65 years and older (adults 19-64 with certain conditions or risk factors) 1 dose  For those who have not received Prevnar 13 vaccine previously   Additional Screening, Testing, and Vaccinations may be recommended on an individualized basis based on family history, health history, risk  factors, and/or exposure.  __________________________________________________________  Diet Recommendations for All Patients  I recommend that all patients maintain a diet low in saturated fats, carbohydrates, and cholesterol. While this can be challenging at first, it is not impossible and small changes can make big differences.  Things to try: Decreasing the amount of soda, sweet tea, and/or juice to one or less per day and replace with water While water is always the first choice, if you do not like water you may consider adding a water additive without sugar to improve the taste other sugar free drinks Replace potatoes with a brightly colored vegetable  Use healthy oils, such as canola oil or olive oil, instead of butter or hard margarine Limit your bread intake to two pieces or less a day Replace regular pasta with low carb pasta options Bake, broil, or grill foods instead of frying Monitor portion sizes  Eat smaller, more frequent meals throughout the day instead of large meals  An important thing to remember is, if you love foods that are not great for your health, you don't have to give them up completely. Instead, allow these foods to be a reward when you have done well. Allowing yourself to still have special treats every once in a while is a nice way to tell yourself thank you for working hard to keep yourself healthy.   Also remember that every day is a new day. If you have a bad day and "fall off the wagon", you can still climb right back up and keep moving along on your journey!  We have resources available to help you!  Some websites that may be helpful include: www.MyPlate.gov  Www.VeryWellFit.com _____________________________________________________________  Activity Recommendations for All Patients  I recommend that all adults get at least 20 minutes of moderate physical activity that elevates your heart rate at least 5 days out of the week.  Some examples  include: Walking or jogging at a pace that allows you to carry on a conversation Cycling (stationary bike or outdoors) Water aerobics Yoga Weight lifting Dancing If physical limitations prevent you from putting stress on your joints, exercise in a pool or seated in a chair are excellent options.  Do determine your MAXIMUM heart rate for activity: 220 - YOUR AGE = MAX Heart Rate   Remember! Do not push yourself too hard.  Start slowly and build up your pace, speed, weight, time in exercise, etc.  Allow your body to rest between exercise and get good sleep. You will need more water than normal when you are exerting yourself. Do not wait until you are thirsty to drink. Drink with a purpose of getting in at least 8, 8 ounce glasses of water a day plus more depending on how much you exercise and sweat.    If you begin to develop dizziness, chest pain, abdominal pain, jaw pain, shortness of breath, headache, vision changes, lightheadedness, or other concerning symptoms,   stop the activity and allow your body to rest. If your symptoms are severe, seek emergency evaluation immediately. If your symptoms are concerning, but not severe, please let us know so that we can recommend further evaluation.     

## 2023-08-02 NOTE — Assessment & Plan Note (Signed)
Medication management: atorvastatin 40 mg daily Lifestyle factors for lowering cholesterol include: Diet therapy - heart-healthy diet rich in fruits, veggies, fiber-rich whole grains, lean meats, chicken, fish (at least twice a week), fat-free or 1% dairy products; foods low in saturated/trans fats, cholesterol, sodium, and sugar. Mediterranean diet has shown to be very heart healthy. Regular exercise - recommend at least 30 minutes a day, 5 times per week Weight management  Repeat CMP and lipid panel today

## 2023-08-03 NOTE — Progress Notes (Signed)
Labs are stable! A1c in prediabetes range - continue low carb diet and regular exercise Triglycerides are a little high, but you were not fasting

## 2023-08-10 DIAGNOSIS — H52223 Regular astigmatism, bilateral: Secondary | ICD-10-CM | POA: Diagnosis not present

## 2023-08-22 ENCOUNTER — Other Ambulatory Visit (HOSPITAL_BASED_OUTPATIENT_CLINIC_OR_DEPARTMENT_OTHER): Payer: Self-pay

## 2023-08-22 MED ORDER — COVID-19 MRNA VAC-TRIS(PFIZER) 30 MCG/0.3ML IM SUSY
0.3000 mL | PREFILLED_SYRINGE | Freq: Once | INTRAMUSCULAR | 0 refills | Status: DC
  Filled 2023-08-22: qty 0.3, 1d supply, fill #0

## 2023-08-23 ENCOUNTER — Ambulatory Visit: Payer: Medicare Other | Admitting: Family Medicine

## 2023-08-23 ENCOUNTER — Other Ambulatory Visit (HOSPITAL_BASED_OUTPATIENT_CLINIC_OR_DEPARTMENT_OTHER): Payer: Self-pay

## 2023-08-23 ENCOUNTER — Encounter: Payer: Self-pay | Admitting: Family Medicine

## 2023-08-23 VITALS — BP 132/58 | HR 83 | Ht 63.0 in | Wt 160.0 lb

## 2023-08-23 DIAGNOSIS — M62838 Other muscle spasm: Secondary | ICD-10-CM | POA: Diagnosis not present

## 2023-08-23 MED ORDER — CYCLOBENZAPRINE HCL 5 MG PO TABS
5.0000 mg | ORAL_TABLET | Freq: Three times a day (TID) | ORAL | 1 refills | Status: AC | PRN
  Filled 2023-08-23: qty 30, 10d supply, fill #0
  Filled 2023-09-29: qty 30, 10d supply, fill #1

## 2023-08-23 NOTE — Progress Notes (Signed)
Acute Office Visit  Subjective:     Patient ID: Donna Mercer, female    DOB: 1956/02/01, 68 y.o.   MRN: [id number]  CC: neck pain.   HPI Patient is in today for neck pain.   Discussed the use of AI scribe software for clinical note transcription with the patient, who gave verbal consent to proceed.  History of Present Illness   The patient presents with a chief complaint of neck pain, which she describes as "aggravating." The discomfort began after she assisted her spouse in moving an object, but she did not immediately notice any pain. The patient initially attributed the discomfort to sleeping in an awkward position. The pain is localized to the left neck and radiates to the shoulder. The patient describes the pain as a tightness or knot, which is exacerbated by certain movements, particularly turning the head to one side.  The patient has attempted self-management with Tylenol and topical analgesics, which provide temporary relief but have not resolved the issue. The patient also reports that massage provides temporary relief. The patient denies any other associated symptoms or recent changes in health status. She is concerned about the impact of the pain on her upcoming work commitments.           ROS All review of systems negative except what is listed in the HPI      Objective:    BP (!) 132/58   Pulse 83   Ht 5\' 3"  (1.6 m)   Wt 160 lb (72.6 kg)   SpO2 100%   BMI 28.34 kg/m    Physical Exam Vitals reviewed.  Constitutional:      Appearance: Normal appearance.  Musculoskeletal:        General: Tenderness present. No swelling. Normal range of motion.     Comments: Normal neck and shoulder ROM; left upper trap tension and pain with palpation  Skin:    Findings: No bruising or erythema.  Neurological:     Mental Status: She is alert and oriented to person, place, and time.  Psychiatric:        Mood and Affect: Mood normal.        Behavior: Behavior normal.         Thought Content: Thought content normal.        Judgment: Judgment normal.     No results found for any visits on 08/23/23.      Assessment & Plan:   Problem List Items Addressed This Visit   None Visit Diagnoses     Neck muscle spasm    -  Primary   Relevant Medications   cyclobenzaprine (FLEXERIL) 5 MG tablet      Acute onset of left neck pain with radiation across upper trap, likely due to muscle spasm. No immediate trauma identified. Pain is aggravated by certain movements and is associated with a sensation of tightness. -Start Flexeril (muscle relaxant), low dose up to three times a day as needed for pain. Cautioned about potential drowsiness. -Advise over-the-counter Aleve twice daily for a few days for additional pain relief. -Heat application and massage as tolerated. -Provided home exercises and stretches to help alleviate muscle tension. -If no improvement in a few days, consider referral to physical therapy for further management and dry needling.        Meds ordered this encounter  Medications   cyclobenzaprine (FLEXERIL) 5 MG tablet    Sig: Take 1 tablet (5 mg total) by mouth 3 (three) times daily as  needed for muscle spasms.    Dispense:  30 tablet    Refill:  1    Order Specific Question:   Supervising Provider    Answer:   Danise Edge A [[id number]]    Return if symptoms worsen or fail to improve.  Clayborne Dana, NP

## 2023-08-29 ENCOUNTER — Encounter: Payer: Self-pay | Admitting: Family Medicine

## 2023-08-29 DIAGNOSIS — M542 Cervicalgia: Secondary | ICD-10-CM

## 2023-08-30 ENCOUNTER — Other Ambulatory Visit (HOSPITAL_BASED_OUTPATIENT_CLINIC_OR_DEPARTMENT_OTHER): Payer: Self-pay

## 2023-08-30 MED ORDER — PREDNISONE 20 MG PO TABS
40.0000 mg | ORAL_TABLET | Freq: Every day | ORAL | 0 refills | Status: AC
  Filled 2023-08-30: qty 10, 5d supply, fill #0

## 2023-08-30 NOTE — Addendum Note (Signed)
Addended by: Hyman Hopes B on: 08/30/2023 11:11 AM   Modules accepted: Orders

## 2023-08-31 ENCOUNTER — Other Ambulatory Visit (HOSPITAL_BASED_OUTPATIENT_CLINIC_OR_DEPARTMENT_OTHER): Payer: Self-pay

## 2024-01-31 ENCOUNTER — Encounter: Payer: Self-pay | Admitting: Family Medicine

## 2024-01-31 ENCOUNTER — Ambulatory Visit (INDEPENDENT_AMBULATORY_CARE_PROVIDER_SITE_OTHER): Payer: Medicare Other | Admitting: Family Medicine

## 2024-01-31 VITALS — BP 127/72 | HR 82 | Ht 63.0 in | Wt 161.0 lb

## 2024-01-31 DIAGNOSIS — E785 Hyperlipidemia, unspecified: Secondary | ICD-10-CM | POA: Diagnosis not present

## 2024-01-31 DIAGNOSIS — I10 Essential (primary) hypertension: Secondary | ICD-10-CM

## 2024-01-31 DIAGNOSIS — R7303 Prediabetes: Secondary | ICD-10-CM | POA: Diagnosis not present

## 2024-01-31 DIAGNOSIS — M8580 Other specified disorders of bone density and structure, unspecified site: Secondary | ICD-10-CM | POA: Diagnosis not present

## 2024-01-31 LAB — COMPREHENSIVE METABOLIC PANEL WITH GFR
ALT: 14 U/L (ref 0–35)
AST: 19 U/L (ref 0–37)
Albumin: 4.8 g/dL (ref 3.5–5.2)
Alkaline Phosphatase: 63 U/L (ref 39–117)
BUN: 17 mg/dL (ref 6–23)
CO2: 31 meq/L (ref 19–32)
Calcium: 10.3 mg/dL (ref 8.4–10.5)
Chloride: 99 meq/L (ref 96–112)
Creatinine, Ser: 0.82 mg/dL (ref 0.40–1.20)
GFR: 73.44 mL/min (ref >60.00)
Glucose, Bld: 96 mg/dL (ref 70–99)
Potassium: 4.8 meq/L (ref 3.5–5.1)
Sodium: 139 meq/L (ref 135–145)
Total Bilirubin: 0.5 mg/dL (ref 0.2–1.2)
Total Protein: 7.2 g/dL (ref 6.0–8.3)

## 2024-01-31 LAB — HEMOGLOBIN A1C: Hgb A1c MFr Bld: 6.5 % (ref 4.6–6.5)

## 2024-01-31 NOTE — Assessment & Plan Note (Signed)
Blood pressure is at goal for age and co-morbidities.   Recommendations: continue lisinopril-hctz 10-12.5 mg daily - BP goal <130/80 - monitor and log blood pressures at home - check around the same time each day in a relaxed setting - Limit salt to <2000 mg/day - Follow DASH eating plan (heart healthy diet) - limit alcohol to 2 standard drinks per day for men and 1 per day for women - avoid tobacco products - get at least 2 hours of regular aerobic exercise weekly Patient aware of signs/symptoms requiring further/urgent evaluation. Labs updated today.

## 2024-01-31 NOTE — Progress Notes (Signed)
 Established Patient Office Visit  Subjective    Patient ID: Donna Mercer, female    DOB: 1956-03-31  Age: 68 y.o. MRN: [id number]  CC:  Chief Complaint  Patient presents with   Medical Management of Chronic Issues    HPI Donna Mercer presents for routine follow-up. Reports she has been doing well. No acute concerns.   Diabetes: - Checking glucose at home: no - Medications: none, lifestyle measures only - Compliance: n/a - Diet: low carb  - Exercise: walking daily  - Denies symptoms of hypoglycemia, polyuria, polydipsia, numbness extremities, foot ulcers/trauma, wounds that are not healing, medication side effects  Lab Results  Component Value Date   HGBA1C 6.2 08/02/2023    Hypertension: - Medications: lisinopril-hctz 10-12.5 mg daily - Compliance: good - Checking BP at home: occasionally, usually normal - Denies any SOB, recurrent headaches, CP, vision changes, LE edema, dizziness, palpitations, or medication side effects. - Diet: general, low-carb - Exercise: walking   Hyperlipidemia: - medications: atorvastatin 40 mg daily - compliance: good - medication SEs: none The 10-year ASCVD risk score (Arnett DK, et al., 2019) is: 9.3%   Values used to calculate the score:     Age: 63 years     Sex: Female     Is Non-Hispanic African American: No     Diabetic: No     Tobacco smoker: No     Systolic Blood Pressure: 127 mmHg     Is BP treated: Yes     HDL Cholesterol: 72.1 mg/dL     Total Cholesterol: 198 mg/dL   Osteopenia: - Currently taking OTC Calcium-Vitamin D supplement  - Done last year at [facility] on [address]  05/29/2023: T-score -1.3; FRAX 10-year probability of major osteoporotic fracture is 15% and hip fracture is 1.5%     Outpatient Encounter Medications as of 01/31/2024  Medication Sig   atorvastatin (LIPITOR) 40 MG tablet Take 1 tablet (40 mg total) by mouth daily.   CALCIUM-VITAMIN D PO Take by mouth daily.   cyclobenzaprine (FLEXERIL) 5 MG  tablet Take 1 tablet (5 mg total) by mouth 3 (three) times daily as needed for muscle spasms.   lisinopril-hydrochlorothiazide (ZESTORETIC) 10-12.5 MG tablet Take 1 tablet by mouth daily.   Multiple Vitamins-Minerals (MULTI FOR HER 50+) TABS Take by mouth. Centrum Silver   valACYclovir (VALTREX) 1000 MG tablet Take two tablets at first sign of cold sore and repeat once in 12 hours.   No facility-administered encounter medications on file as of 01/31/2024.    Past Medical History:  Diagnosis Date   Hyperlipidemia    Hypertension    Osteopenia     Past Surgical History:  Procedure Laterality Date   BUNIONECTOMY Left    CESAREAN SECTION     MANDIBLE SURGERY  1990    Family History  Problem Relation Age of Onset   Hyperlipidemia Mother    Heart attack Father    Breast cancer Maternal Grandmother    Breast cancer Maternal Aunt     Social History   Socioeconomic History   Marital status: Married    Spouse name: Not on file   Number of children: Not on file   Years of education: Not on file   Highest education level: Not on file  Occupational History   Not on file  Tobacco Use   Smoking status: Never   Smokeless tobacco: Never  Substance and Sexual Activity   Alcohol use: Yes    Comment: rarely  Drug use: Never   Sexual activity: Not on file  Other Topics Concern   Not on file  Social History Narrative   Not on file   Social Drivers of Health   Financial Resource Strain: Low Risk  (06/24/2023)   Received from [facility]   Overall Financial Resource Strain (CARDIA)    Difficulty of Paying Living Expenses: Not hard at all  Food Insecurity: No Food Insecurity (06/24/2023)   [facility] from [facility]   Hunger Vital Sign    Worried About Running Out of Food in the Last Year: Never true    Ran Out of Food in the Last Year: Never true  Transportation Needs: No Transportation Needs (06/24/2023)   Received from [facility] - Transportation    Lack of  Transportation (Medical): No    Lack of Transportation (Non-Medical): No  Physical Activity: Insufficiently Active (06/24/2023)   Received from [facility]   Exercise Vital Sign    Days of Exercise per Week: 3 days    Minutes of Exercise per Session: 20 min  Stress: No Stress Concern Present (06/24/2023)   Received from [facility] - Occupational Stress Questionnaire    Feeling of Stress : Only a little  Social Connections: Socially Integrated (06/24/2023)   Received from [facility]   Social Network    How would you rate your social network (family, work, friends)?: Good participation with social networks  Intimate Partner Violence: Not At Risk (06/24/2023)   Received from [facility]   HITS    Over the last 12 months how often did your partner physically hurt you?: Never    Over the last 12 months how often did your partner insult you or talk down to you?: Never    Over the last 12 months how often did your partner threaten you with physical harm?: Never    Over the last 12 months how often did your partner scream or curse at you?: Rarely    ROS All review of systems negative except what is listed in the HPI      Objective    BP 127/72   Pulse 82   Ht 5\' 3"  (1.6 m)   Wt 161 lb (73 kg)   SpO2 97%   BMI 28.52 kg/m   Physical Exam Vitals reviewed.  Constitutional:      Appearance: Normal appearance.  Cardiovascular:     Rate and Rhythm: Normal rate and regular rhythm.     Heart sounds: Normal heart sounds.  Pulmonary:     Effort: Pulmonary effort is normal.     Breath sounds: Normal breath sounds.  Skin:    General: Skin is warm and dry.  Neurological:     Mental Status: She is alert and oriented to person, place, and time.  Psychiatric:        Mood and Affect: Mood normal.        Behavior: Behavior normal.        Thought Content: Thought content normal.        Judgment: Judgment normal.         Assessment  & Plan:   Problem List Items Addressed This Visit       Active Problems   HLD (hyperlipidemia)   Medication management: atorvastatin 40 mg daily Lifestyle factors for lowering cholesterol include: Diet therapy - heart-healthy diet rich in fruits, veggies, fiber-rich whole grains, lean meats, chicken, fish (at least twice  a week), fat-free or 1% dairy products; foods low in saturated/trans fats, cholesterol, sodium, and sugar. Mediterranean diet has shown to be very heart healthy. Regular exercise - recommend at least 30 minutes a day, 5 times per week Weight management         Hypertension   Blood pressure is at goal for age and co-morbidities.   Recommendations: continue lisinopril-hctz 10-12.5 mg daily - BP goal <130/80 - monitor and log blood pressures at home - check around the same time each day in a relaxed setting - Limit salt to <2000 mg/day - Follow DASH eating plan (heart healthy diet) - limit alcohol to 2 standard drinks per day for men and 1 per day for women - avoid tobacco products - get at least 2 hours of regular aerobic exercise weekly Patient aware of signs/symptoms requiring further/urgent evaluation. Labs updated today.       Relevant Orders   Comprehensive metabolic panel with GFR   Prediabetes - Primary   Last HbA1c was 6.2%.  Reports general diet with attention to carb intake and regular walking for exercise.       Relevant Orders   Hemoglobin A1c   Comprehensive metabolic panel with GFR   Osteopenia   On Calcium-Vitamin D combo and multivitamin. Continue exercise as tolerated.        Return in about 6 months (around 08/01/2024) for physical; schedule AWV.   Clayborne Dana, NP

## 2024-01-31 NOTE — Assessment & Plan Note (Signed)
 On Calcium-Vitamin D combo and multivitamin. Continue exercise as tolerated.

## 2024-01-31 NOTE — Assessment & Plan Note (Signed)
 Medication management: atorvastatin 40 mg daily  Lifestyle factors for lowering cholesterol include: Diet therapy - heart-healthy diet rich in fruits, veggies, fiber-rich whole grains, lean meats, chicken, fish (at least twice a week), fat-free or 1% dairy products; foods low in saturated/trans fats, cholesterol, sodium, and sugar. Mediterranean diet has shown to be very heart healthy. Regular exercise - recommend at least 30 minutes a day, 5 times per week Weight management

## 2024-01-31 NOTE — Assessment & Plan Note (Signed)
 Last HbA1c was 6.2%.  Reports general diet with attention to carb intake and regular walking for exercise.

## 2024-02-06 ENCOUNTER — Ambulatory Visit (INDEPENDENT_AMBULATORY_CARE_PROVIDER_SITE_OTHER): Payer: Medicare Other

## 2024-02-06 VITALS — Ht 63.0 in | Wt 161.0 lb

## 2024-02-06 DIAGNOSIS — Z0000 Encounter for general adult medical examination without abnormal findings: Secondary | ICD-10-CM

## 2024-02-06 NOTE — Patient Instructions (Addendum)
 Ms. Feig , Thank you for taking time to come for your Medicare Wellness Visit. I appreciate your ongoing commitment to your health goals. Please review the following plan we discussed and let me know if I can assist you in the future.   Referrals/Orders/Follow-Ups/Clinician Recommendations: Follow up next office appointment Hepatitis-C screening.  This is a list of the screening recommended for you and due dates:  Health Maintenance  Topic Date Due   Hepatitis C Screening  Never done   COVID-19 Vaccine (8 - 2024-25 season) 02/20/2024   Flu Shot  05/31/2024   Medicare Annual Wellness Visit  02/05/2025   Mammogram  05/28/2025   Colon Cancer Screening  04/15/2027   DTaP/Tdap/Td vaccine (3 - Td or Tdap) 12/11/2031   Pneumonia Vaccine  Completed   DEXA scan (bone density measurement)  Completed   Zoster (Shingles) Vaccine  Completed   HPV Vaccine  Aged Out    Advanced directives: (Copy Requested) Please bring a copy of your health care power of attorney and living will to the office to be added to your chart at your convenience. You can mail to [facility] [address] [place], [place] [address] or email to [email or web address]  Next Medicare Annual Wellness Visit scheduled for next year: Yes

## 2024-02-06 NOTE — Progress Notes (Signed)
 Subjective:   Donna Mercer is a 68 y.o. who presents for a Medicare Wellness preventive visit.  Visit Complete: Virtual I connected with  Donna Mercer on 02/06/24 by a audio enabled telemedicine application and verified that I am speaking with the correct person using two identifiers.  Patient Location: Home  Provider Location: Home Office  I discussed the limitations of evaluation and management by telemedicine. The patient expressed understanding and agreed to proceed.  Vital Signs: Because this visit was a virtual/telehealth visit, some criteria may be missing or patient reported. Any vitals not documented were not able to be obtained and vitals that have been documented are patient reported.    Persons Participating in Visit: Patient.  AWV Questionnaire: No: Patient Medicare AWV questionnaire was not completed prior to this visit.  Cardiac Risk Factors include: advanced age (>24men, >40 women);hypertension     Objective:    Today's Vitals   02/06/24 1511  Weight: 161 lb (73 kg)  Height: 5\' 3"  (1.6 m)   Body mass index is 28.52 kg/m.     02/06/2024    3:16 PM  Advanced Directives  Does Patient Have a Medical Advance Directive? Yes  Type of Estate agent of [place];Living will  Copy of Healthcare Power of Attorney in Chart? No - copy requested    Current Medications (verified) Outpatient Encounter Medications as of 02/06/2024  Medication Sig   atorvastatin (LIPITOR) 40 MG tablet Take 1 tablet (40 mg total) by mouth daily.   CALCIUM-VITAMIN D PO Take by mouth daily.   cyclobenzaprine (FLEXERIL) 5 MG tablet Take 1 tablet (5 mg total) by mouth 3 (three) times daily as needed for muscle spasms.   lisinopril-hydrochlorothiazide (ZESTORETIC) 10-12.5 MG tablet Take 1 tablet by mouth daily.   Multiple Vitamins-Minerals (MULTI FOR HER 50+) TABS Take by mouth. Centrum Silver   valACYclovir (VALTREX) 1000 MG tablet Take two tablets at first sign of cold  sore and repeat once in 12 hours.   No facility-administered encounter medications on file as of 02/06/2024.    Allergies (verified) Patient has no known allergies.   History: Past Medical History:  Diagnosis Date   Hyperlipidemia    Hypertension    Osteopenia    Past Surgical History:  Procedure Laterality Date   BUNIONECTOMY Left    CESAREAN SECTION     MANDIBLE SURGERY  1990   Family History  Problem Relation Age of Onset   Hyperlipidemia Mother    Heart attack Father    Breast cancer Maternal Grandmother    Breast cancer Maternal Aunt    Social History   Socioeconomic History   Marital status: Married    Spouse name: Not on file   Number of children: Not on file   Years of education: Not on file   Highest education level: Not on file  Occupational History   Not on file  Tobacco Use   Smoking status: Never   Smokeless tobacco: Never  Substance and Sexual Activity   Alcohol use: Yes    Comment: rarely   Drug use: Never   Sexual activity: Not on file  Other Topics Concern   Not on file  Social History Narrative   Not on file   Social Drivers of Health   Financial Resource Strain: Low Risk  (02/06/2024)   Overall Financial Resource Strain (CARDIA)    Difficulty of Paying Living Expenses: Not hard at all  Food Insecurity: No Food Insecurity (02/06/2024)   Hunger  Vital Sign    Worried About Programme researcher, broadcasting/film/video in the Last Year: Never true    Ran Out of Food in the Last Year: Never true  Transportation Needs: No Transportation Needs (02/06/2024)   PRAPARE - Administrator, Civil Service (Medical): No    Lack of Transportation (Non-Medical): No  Physical Activity: Insufficiently Active (02/06/2024)   Exercise Vital Sign    Days of Exercise per Week: 3 days    Minutes of Exercise per Session: 20 min  Stress: No Stress Concern Present (02/06/2024)   [employer] [facility] Occup[facility] - Occupational Stress Questionnaire    Feeling of Stress : Not at  all  Social Connections: Socially Integrated (02/06/2024)   Social Connection and Isolation Panel [NHANES]    Frequency of Communication with Friends and Family: More than three times a week    Frequency of Social Gatherings with Friends and Family: More than three times a week    Attends Religious Services: More than 4 times per year    Active Member of [employer] or Organizations: Yes    Attends Engineer, structural: More than 4 times per year    Marital Status: Married    Tobacco Counseling Counseling given: Not Answered    Clinical Intake:  Pre-visit preparation completed: Yes  Pain : No/denies pain     BMI - recorded: 28.52 Nutritional Status: BMI 25 -29 Overweight Nutritional Risks: None Diabetes: No  Lab Results  Component Value Date   HGBA1C 6.5 01/31/2024   HGBA1C 6.2 08/02/2023     How often do you need to have someone help you when you read instructions, pamphlets, or other written materials from your doctor or pharmacy?: 1 - Never  Interpreter Needed?: No  Information entered by :: Donna Mulligan LPN   Activities of Daily Living     02/06/2024    3:15 PM  In your present state of health, do you have any difficulty performing the following activities:  Hearing? 0  Vision? 0  Difficulty concentrating or making decisions? 0  Walking or climbing stairs? 0  Dressing or bathing? 0  Doing errands, shopping? 0  Preparing Food and eating ? N  Using the Toilet? N  In the past six months, have you accidently leaked urine? N  Do you have problems with loss of bowel control? N  Managing your Medications? N  Managing your Finances? N  Housekeeping or managing your Housekeeping? N    Patient Care Team: Donna Dana, NP as PCP - General (Family Medicine)  Indicate any recent Medical Services you may have received from other than [facility] providers in the past year (date may be approximate).     Assessment:   This is a routine wellness examination for  [facility].  Hearing/Vision screen Hearing Screening - Comments:: Denies hearing difficulties   Vision Screening - Comments:: Wears rx glasses - up to date with routine eye exams with  Donna Mercer   Goals Addressed               This Visit's Progress     Increase physical activity (pt-stated)        Remain Active.       Depression Screen     02/06/2024    3:14 PM 08/02/2023   11:18 AM  PHQ 2/9 Scores  PHQ - 2 Score 0 0  PHQ- 9 Score  1    Fall Risk     02/06/2024  3:15 PM 08/02/2023   11:18 AM  Fall Risk   Falls in the past year? 0 1  Number falls in past yr: 0 0  Injury with Fall? 0 0  Risk for fall due to : No Fall Risks History of fall(s)  Follow up Falls prevention discussed;Falls evaluation completed Falls evaluation completed    MEDICARE RISK AT HOME:  Medicare Risk at Home Any stairs in or around the home?: Yes If so, are there any without handrails?: No Home free of loose throw rugs in walkways, pet beds, electrical cords, etc?: Yes Adequate lighting in your home to reduce risk of falls?: Yes Life alert?: No Use of a cane, walker or w/c?: No Grab bars in the bathroom?: Yes Shower chair or bench in shower?: No Elevated toilet seat or a handicapped toilet?: Yes  TIMED UP AND GO:  Was the test performed?  No  Cognitive Function: 6CIT completed        02/06/2024    3:16 PM  6CIT Screen  What Year? 0 points  What month? 0 points  What time? 0 points  Count back from 20 0 points  Months in reverse 0 points  Repeat phrase 0 points  Total Score 0 points    Immunizations Immunization History  Administered Date(s) Administered   19-influenza Whole 07/03/2017   Fluad Quad(high Dose 65+) 08/06/2021, 07/21/2022   Fluad Trivalent(High Dose 65+) 08/10/2012   H1N1 08/10/2012   Influenza Nasal 08/03/2016, 07/03/2017   Influenza Split 08/18/2009, 07/14/2010, 08/16/2011, 08/06/2013, 08/13/2014, 08/11/2015   Influenza, Quadrivalent, Recombinant, Inj, Pf  07/03/2017, 07/28/2020   Influenza, Seasonal, Injecte, Preservative Fre 08/06/2013, 08/11/2015, 08/03/2016, 07/27/2018, 07/21/2022   Influenza,inj,Quad PF,6+ Mos 07/03/2017, 07/27/2018, 06/29/2019   Influenza,inj,Quad PF,6-35 Mos 08/03/2016, 08/06/2021   Influenza,inj,quad, With Preservative 08/03/2016, 07/03/2017   Influenza,trivalent, recombinat, inj, PF 08/10/2012   Influenza-Unspecified 08/10/2012, 08/01/2023   Novel Infuenza-h1n1-09 08/10/2012   PFIZER Comirnaty(Gray Top)Covid-19 Tri-Sucrose Vaccine 02/05/2021   PFIZER(Purple Top)SARS-COV-2 Vaccination 01/15/2020, 02/05/2020, 08/14/2020   Pfizer Covid-19 Vaccine Bivalent Booster 64yrs & up 08/26/2021   Pfizer(Comirnaty)Fall Seasonal Vaccine 12 years and older 08/04/2022, 08/22/2023   Pneumococcal Conjugate-13 12/11/2020   Pneumococcal Polysaccharide-23 05/31/2022   RSV,unspecified 08/18/2022   Tdap 06/07/2011, 12/10/2021   Zoster Recombinant(Shingrix) 07/03/2017, 09/02/2017   Zoster, Live 09/04/2015    Screening Tests Health Maintenance  Topic Date Due   Hepatitis C Screening  Never done   COVID-19 Vaccine (8 - 2024-25 season) 02/20/2024   INFLUENZA VACCINE  05/31/2024   Medicare Annual Wellness (AWV)  02/05/2025   MAMMOGRAM  05/28/2025   Colonoscopy  04/15/2027   DTaP/Tdap/Td (3 - Td or Tdap) 12/11/2031   Pneumonia Vaccine 8+ Years old  Completed   DEXA SCAN  Completed   Zoster Vaccines- Shingrix  Completed   HPV VACCINES  Aged Out    Health Maintenance  Health Maintenance Due  Topic Date Due   Hepatitis C Screening  Never done   Health Maintenance Items Addressed: Hepatitis C Screening Deferred to next office appointment.  Additional Screening:  Vision Screening: Recommended annual ophthalmology exams for early detection of glaucoma and other disorders of the eye.  Dental Screening: Recommended annual dental exams for proper oral hygiene  Community Resource Referral / Chronic Care Management: CRR required  this visit?  No   CCM required this visit?  No     Plan:     I have personally reviewed and noted the following in the patient's chart:   Medical and social history Use  of alcohol, tobacco or illicit drugs  Current medications and supplements including opioid prescriptions. Patient is not currently taking opioid prescriptions. Functional ability and status Nutritional status Physical activity Advanced directives List of other physicians Hospitalizations, surgeries, and ER visits in previous 12 months Vitals Screenings to include cognitive, depression, and falls Referrals and appointments  In addition, I have reviewed and discussed with patient certain preventive protocols, quality metrics, and best practice recommendations. A written personalized care plan for preventive services as well as general preventive health recommendations were provided to patient.     Tillie Rung, LPN   [id number]/8118   After Visit Summary: (MyChart) Due to this being a telephonic visit, the after visit summary with patients personalized plan was offered to patient via MyChart   Notes: Nothing significant to report at this time.

## 2024-03-01 ENCOUNTER — Telehealth: Admitting: Nurse Practitioner

## 2024-03-01 DIAGNOSIS — R399 Unspecified symptoms and signs involving the genitourinary system: Secondary | ICD-10-CM

## 2024-03-02 MED ORDER — CEPHALEXIN 500 MG PO CAPS: 500.0000 mg | ORAL_CAPSULE | Freq: Two times a day (BID) | ORAL | 0 refills | Status: AC

## 2024-03-02 NOTE — Progress Notes (Signed)
 I have spent 5 minutes in review of e-visit questionnaire, review and updating patient chart, medical decision making and response to patient.   Claiborne Rigg, NP

## 2024-03-02 NOTE — Progress Notes (Signed)

## 2024-03-29 ENCOUNTER — Other Ambulatory Visit: Payer: Self-pay | Admitting: Medical Genetics

## 2024-03-29 DIAGNOSIS — Z006 Encounter for examination for normal comparison and control in clinical research program: Secondary | ICD-10-CM

## 2024-04-08 LAB — GENECONNECT MOLECULAR SCREEN: Genetic Analysis Overall Interpretation: NEGATIVE

## 2024-06-05 DIAGNOSIS — Z1231 Encounter for screening mammogram for malignant neoplasm of breast: Secondary | ICD-10-CM | POA: Diagnosis not present

## 2024-06-05 LAB — HM MAMMOGRAPHY

## 2024-06-14 ENCOUNTER — Encounter: Payer: Self-pay | Admitting: Neurology

## 2024-07-02 ENCOUNTER — Other Ambulatory Visit (HOSPITAL_BASED_OUTPATIENT_CLINIC_OR_DEPARTMENT_OTHER): Payer: Self-pay

## 2024-07-03 ENCOUNTER — Other Ambulatory Visit (HOSPITAL_BASED_OUTPATIENT_CLINIC_OR_DEPARTMENT_OTHER): Payer: Self-pay

## 2024-07-04 ENCOUNTER — Other Ambulatory Visit (HOSPITAL_BASED_OUTPATIENT_CLINIC_OR_DEPARTMENT_OTHER): Payer: Self-pay

## 2024-07-05 ENCOUNTER — Other Ambulatory Visit (HOSPITAL_BASED_OUTPATIENT_CLINIC_OR_DEPARTMENT_OTHER): Payer: Self-pay

## 2024-07-05 ENCOUNTER — Other Ambulatory Visit: Payer: Self-pay | Admitting: Family Medicine

## 2024-07-05 MED ORDER — ATORVASTATIN CALCIUM 40 MG PO TABS
40.0000 mg | ORAL_TABLET | Freq: Every day | ORAL | 0 refills | Status: DC
  Filled 2024-07-05: qty 90, 90d supply, fill #0

## 2024-07-21 ENCOUNTER — Other Ambulatory Visit (HOSPITAL_BASED_OUTPATIENT_CLINIC_OR_DEPARTMENT_OTHER): Payer: Self-pay

## 2024-07-22 ENCOUNTER — Other Ambulatory Visit: Payer: Self-pay | Admitting: Family Medicine

## 2024-07-22 ENCOUNTER — Other Ambulatory Visit (HOSPITAL_BASED_OUTPATIENT_CLINIC_OR_DEPARTMENT_OTHER): Payer: Self-pay

## 2024-07-22 MED ORDER — LISINOPRIL-HYDROCHLOROTHIAZIDE 10-12.5 MG PO TABS
1.0000 | ORAL_TABLET | Freq: Every day | ORAL | 0 refills | Status: DC
  Filled 2024-07-22: qty 90, 90d supply, fill #0

## 2024-08-06 DIAGNOSIS — K080 Exfoliation of teeth due to systemic causes: Secondary | ICD-10-CM | POA: Diagnosis not present

## 2024-08-07 ENCOUNTER — Encounter: Admitting: Family Medicine

## 2024-08-09 ENCOUNTER — Ambulatory Visit: Payer: Self-pay | Admitting: Family Medicine

## 2024-08-09 ENCOUNTER — Ambulatory Visit: Admitting: Family Medicine

## 2024-08-09 ENCOUNTER — Encounter: Payer: Self-pay | Admitting: Family Medicine

## 2024-08-09 VITALS — BP 134/67 | HR 83 | Ht 63.0 in | Wt 160.0 lb

## 2024-08-09 DIAGNOSIS — Z0000 Encounter for general adult medical examination without abnormal findings: Secondary | ICD-10-CM

## 2024-08-09 DIAGNOSIS — I10 Essential (primary) hypertension: Secondary | ICD-10-CM

## 2024-08-09 DIAGNOSIS — R7303 Prediabetes: Secondary | ICD-10-CM | POA: Diagnosis not present

## 2024-08-09 DIAGNOSIS — Z23 Encounter for immunization: Secondary | ICD-10-CM

## 2024-08-09 DIAGNOSIS — E785 Hyperlipidemia, unspecified: Secondary | ICD-10-CM | POA: Diagnosis not present

## 2024-08-09 LAB — CBC WITH DIFFERENTIAL/PLATELET
Basophils Absolute: 0 K/uL (ref 0.0–0.1)
Basophils Relative: 0.9 % (ref 0.0–3.0)
Eosinophils Absolute: 0.1 K/uL (ref 0.0–0.7)
Eosinophils Relative: 2.3 % (ref 0.0–5.0)
HCT: 38.5 % (ref 36.0–46.0)
Hemoglobin: 12.8 g/dL (ref 12.0–15.0)
Lymphocytes Relative: 34.4 % (ref 12.0–46.0)
Lymphs Abs: 1.6 K/uL (ref 0.7–4.0)
MCHC: 33.3 g/dL (ref 30.0–36.0)
MCV: 90 fl (ref 78.0–100.0)
Monocytes Absolute: 0.5 K/uL (ref 0.1–1.0)
Monocytes Relative: 9.9 % (ref 3.0–12.0)
Neutro Abs: 2.4 K/uL (ref 1.4–7.7)
Neutrophils Relative %: 52.5 % (ref 43.0–77.0)
Platelets: 302 K/uL (ref 150.0–400.0)
RBC: 4.27 Mil/uL (ref 3.87–5.11)
RDW: 13.4 % (ref 11.5–15.5)
WBC: 4.5 K/uL (ref 4.0–10.5)

## 2024-08-09 LAB — COMPREHENSIVE METABOLIC PANEL WITH GFR
ALT: 15 U/L (ref 0–35)
AST: 19 U/L (ref 0–37)
Albumin: 4.8 g/dL (ref 3.5–5.2)
Alkaline Phosphatase: 62 U/L (ref 39–117)
BUN: 16 mg/dL (ref 6–23)
CO2: 31 meq/L (ref 19–32)
Calcium: 9.8 mg/dL (ref 8.4–10.5)
Chloride: 100 meq/L (ref 96–112)
Creatinine, Ser: 0.84 mg/dL (ref 0.40–1.20)
GFR: 71.09 mL/min (ref >60.00)
Glucose, Bld: 101 mg/dL — ABNORMAL HIGH (ref 70–99)
Potassium: 4.4 meq/L (ref 3.5–5.1)
Sodium: 139 meq/L (ref 135–145)
Total Bilirubin: 0.5 mg/dL (ref 0.2–1.2)
Total Protein: 7.2 g/dL (ref 6.0–8.3)

## 2024-08-09 LAB — LIPID PANEL
Cholesterol: 195 mg/dL (ref 0–200)
HDL: 74 mg/dL (ref >39.00)
LDL Cholesterol: 91 mg/dL (ref 0–99)
NonHDL: 121.35
Total CHOL/HDL Ratio: 3
Triglycerides: 151 mg/dL — ABNORMAL HIGH (ref 0.0–149.0)
VLDL: 30.2 mg/dL (ref 0.0–40.0)

## 2024-08-09 LAB — HEMOGLOBIN A1C: Hgb A1c MFr Bld: 6.5 % (ref 4.6–6.5)

## 2024-08-09 LAB — TSH: TSH: 3.84 u[IU]/mL (ref 0.35–5.50)

## 2024-08-09 NOTE — Progress Notes (Signed)
 Complete physical exam  Patient: Donna Mercer   DOB: [birth year]   68 y.o. Female  MRN: [id number]  Subjective:    Chief Complaint  Patient presents with   Annual Exam    Donna Mercer is a 68 y.o. female who presents today for a complete physical exam. She reports consuming a general diet. She is staying active, walking regularly.  She generally feels well. She reports sleeping well. She does not have additional problems to discuss today.    Currently lives with: husband Acute concerns or interim problems since last visit: no  Vision concerns: no Dental concerns: no   ETOH use: social Nicotine use: no Recreational drugs/illegal substances: no        Most recent fall risk assessment:    08/09/2024    8:24 AM  Fall Risk   Falls in the past year? 0  Number falls in past yr: 0  Injury with Fall? 0  Risk for fall due to : No Fall Risks  Follow up Falls evaluation completed     Most recent depression screenings:    08/09/2024    8:24 AM 02/06/2024    3:14 PM  PHQ 2/9 Scores  PHQ - 2 Score 0 0  PHQ- 9 Score 0             Patient Care Team: Almarie Waddell NOVAK, NP as PCP - General (Family Medicine)   Outpatient Medications Prior to Visit  Medication Sig   atorvastatin  (LIPITOR) 40 MG tablet Take 1 tablet (40 mg total) by mouth daily.   CALCIUM -VITAMIN D  PO Take by mouth daily.   cyclobenzaprine  (FLEXERIL ) 5 MG tablet Take 1 tablet (5 mg total) by mouth 3 (three) times daily as needed for muscle spasms.   lisinopril -hydrochlorothiazide  (ZESTORETIC ) 10-12.5 MG tablet Take 1 tablet by mouth daily.   Multiple Vitamins-Minerals (MULTI FOR HER 50+) TABS Take by mouth. Centrum Silver   valACYclovir  (VALTREX ) 1000 MG tablet Take two tablets at first sign of cold sore and repeat once in 12 hours.   No facility-administered medications prior to visit.    ROS All review of systems negative except what is listed in the HPI        Objective:     BP 134/67    Pulse 83   Ht 5' 3 (1.6 m)   Wt 160 lb (72.6 kg)   SpO2 100%   BMI 28.34 kg/m    Physical Exam Vitals reviewed.  Constitutional:      General: She is not in acute distress.    Appearance: Normal appearance. She is not ill-appearing.  HENT:     Head: Normocephalic and atraumatic.     Right Ear: Tympanic membrane normal.     Left Ear: Tympanic membrane normal.     Nose: Nose normal.     Mouth/Throat:     Mouth: Mucous membranes are moist.     Pharynx: Oropharynx is clear.  Eyes:     Extraocular Movements: Extraocular movements intact.     Conjunctiva/sclera: Conjunctivae normal.     Pupils: Pupils are equal, round, and reactive to light.  Neck:     Vascular: No carotid bruit.  Cardiovascular:     Rate and Rhythm: Normal rate and regular rhythm.     Pulses: Normal pulses.     Heart sounds: Normal heart sounds.  Pulmonary:     Effort: Pulmonary effort is normal.     Breath sounds: Normal breath sounds.  Abdominal:  General: Abdomen is flat. Bowel sounds are normal. There is no distension.     Palpations: Abdomen is soft. There is no mass.     Tenderness: There is no abdominal tenderness. There is no right CVA tenderness, left CVA tenderness, guarding or rebound.  Genitourinary:    Comments: Deferred exam Musculoskeletal:        General: Normal range of motion.     Cervical back: Normal range of motion and neck supple. No tenderness.     Right lower leg: No edema.     Left lower leg: No edema.  Lymphadenopathy:     Cervical: No cervical adenopathy.  Skin:    General: Skin is warm and dry.     Capillary Refill: Capillary refill takes less than 2 seconds.  Neurological:     General: No focal deficit present.     Mental Status: She is alert and oriented to person, place, and time. Mental status is at baseline.  Psychiatric:        Mood and Affect: Mood normal.        Behavior: Behavior normal.        Thought Content: Thought content normal.        Judgment:  Judgment normal.         No results found for any visits on 08/09/24.     Assessment & Plan:    Routine Health Maintenance and Physical Exam Discussed health promotion and safety including diet and exercise recommendations, dental health, and injury prevention. Tobacco cessation if applicable. Seat belts, sunscreen, smoke detectors, etc.    Immunization History  Administered Date(s) Administered   19-influenza Whole 07/03/2017   Fluad  Quad(high Dose 65+) 08/06/2021, 07/21/2022   Fluad  Trivalent(High Dose 65+) 08/10/2012   H1N1 08/10/2012   INFLUENZA, HIGH DOSE SEASONAL PF 08/09/2024   Influenza Nasal 08/03/2016, 07/03/2017   Influenza Split 08/18/2009, 07/14/2010, 08/16/2011, 08/06/2013, 08/13/2014, 08/11/2015   Influenza, Quadrivalent, Recombinant, Inj, Pf 07/03/2017, 07/28/2020   Influenza, Seasonal, Injecte, Preservative Fre 08/10/2012, 08/06/2013, 08/11/2015, 08/03/2016, 07/27/2018, 07/21/2022   Influenza,inj,Quad PF,6+ Mos 07/03/2017, 07/27/2018, 06/29/2019   Influenza,inj,Quad PF,6-35 Mos 08/03/2016, 08/06/2021   Influenza,inj,quad, With Preservative 08/03/2016, 07/03/2017   Influenza,trivalent, recombinat, inj, PF 08/10/2012   Influenza-Unspecified 08/10/2012, 08/01/2023   Novel Infuenza-h1n1-09 08/10/2012   PFIZER Comirnaty (Gray Top)Covid-19 Tri-Sucrose Vaccine 02/05/2021   PFIZER(Purple Top)SARS-COV-2 Vaccination 11/21/2019, 12/12/2019, 01/15/2020, 02/05/2020, 06/18/2020, 08/14/2020   Pfizer Covid-19 Vaccine Bivalent Booster 11yrs & up 08/26/2021   Pfizer(Comirnaty )Fall Seasonal Vaccine 12 years and older 08/04/2022, 08/22/2023   Pneumococcal Conjugate-13 12/11/2020   Pneumococcal Polysaccharide-23 05/31/2022   RSV,unspecified 08/18/2022   Respiratory Syncytial Virus Vaccine,Recomb Aduvanted(Arexvy) 08/18/2022   Tdap 06/07/2011, 12/10/2021   Zoster Recombinant(Shingrix) 07/03/2017, 09/02/2017   Zoster, Live 09/04/2015    Health Maintenance  Topic Date Due    Hepatitis C Screening  Never done   COVID-19 Vaccine (11 - 2025-26 season) 08/24/2024 (Originally 07/01/2024)   Medicare Annual Wellness (AWV)  02/05/2025   Mammogram  06/05/2026   Colonoscopy  04/15/2027   DTaP/Tdap/Td (3 - Td or Tdap) 12/11/2031   Pneumococcal Vaccine: 50+ Years  Completed   Influenza Vaccine  Completed   DEXA SCAN  Completed   Zoster Vaccines- Shingrix  Completed   Meningococcal B Vaccine  Aged Out        Problem List Items Addressed This Visit       Active Problems   HLD (hyperlipidemia)   Relevant Orders   Lipid panel   Hypertension   Relevant Orders  Comprehensive metabolic panel with GFR   Prediabetes - Primary   Relevant Orders   Comprehensive metabolic panel with GFR   Hemoglobin A1c   Other Visit Diagnoses       Annual physical exam       Relevant Orders   CBC with Differential/Platelet   Comprehensive metabolic panel with GFR   Lipid panel   TSH   Hemoglobin A1c     Immunization due       Relevant Orders   Flu vaccine HIGH DOSE PF(Fluzone Trivalent) (Completed)          PATIENT COUNSELING:     Recommend that most people either abstain from alcohol or drink within safe limits (<=14/week and <=4 drinks/occasion for males, <=7/weeks and <= 3 drinks/occasion for females) and that the risk for alcohol disorders and other health effects rises proportionally with the number of drinks per week and how often a drinker exceeds daily limits.   Diet: Recommend to adjust caloric intake to maintain or achieve ideal body weight, to reduce intake of dietary saturated fat and total fat, to limit sodium intake by avoiding high sodium foods and not adding table salt, and to maintain adequate dietary potassium and calcium  preferably from fresh fruits, vegetables, and low-fat dairy products.   Emphasized the importance of regular exercise.  Injury prevention: Recommend seatbelts, safety helmets, smoke detector, etc..   Dental health: Recommend  regular tooth brushing, flossing, and dental visits.      Return in about 6 months (around 02/07/2025) for chronic disease management.     Waddell KATHEE Mon, NP

## 2024-08-15 DIAGNOSIS — H52223 Regular astigmatism, bilateral: Secondary | ICD-10-CM | POA: Diagnosis not present

## 2024-08-22 ENCOUNTER — Telehealth: Payer: Self-pay | Admitting: Neurology

## 2024-08-22 NOTE — Telephone Encounter (Signed)
 Mychart message sent to patient.    Copied from CRM [phone]. 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 States she's needing the information to turn into [facility]. Please follow up with patient at [phone]

## 2024-09-13 ENCOUNTER — Telehealth: Admitting: Physician Assistant

## 2024-09-13 DIAGNOSIS — R399 Unspecified symptoms and signs involving the genitourinary system: Secondary | ICD-10-CM | POA: Diagnosis not present

## 2024-09-13 MED ORDER — CEPHALEXIN 500 MG PO CAPS: 500.0000 mg | ORAL_CAPSULE | Freq: Two times a day (BID) | ORAL | 0 refills | Status: AC

## 2024-09-13 NOTE — Progress Notes (Signed)

## 2024-09-30 ENCOUNTER — Other Ambulatory Visit (HOSPITAL_BASED_OUTPATIENT_CLINIC_OR_DEPARTMENT_OTHER): Payer: Self-pay

## 2024-09-30 ENCOUNTER — Other Ambulatory Visit: Payer: Self-pay | Admitting: Family Medicine

## 2024-09-30 MED ORDER — ATORVASTATIN CALCIUM 40 MG PO TABS
40.0000 mg | ORAL_TABLET | Freq: Every day | ORAL | 1 refills | Status: AC
  Filled 2024-09-30: qty 90, 90d supply, fill #0

## 2024-10-19 ENCOUNTER — Other Ambulatory Visit: Payer: Self-pay | Admitting: Family Medicine

## 2024-10-21 ENCOUNTER — Other Ambulatory Visit: Payer: Self-pay

## 2024-10-21 ENCOUNTER — Other Ambulatory Visit (HOSPITAL_BASED_OUTPATIENT_CLINIC_OR_DEPARTMENT_OTHER): Payer: Self-pay

## 2024-10-21 MED ORDER — LISINOPRIL-HYDROCHLOROTHIAZIDE 10-12.5 MG PO TABS
1.0000 | ORAL_TABLET | Freq: Every day | ORAL | 1 refills | Status: AC
  Filled 2024-10-21: qty 90, 90d supply, fill #0

## 2025-02-11 ENCOUNTER — Ambulatory Visit
